# Patient Record
Sex: Male | Born: 1955
Health system: Southern US, Community
[De-identification: ages and names within clinical notes are randomized; demographics above are authoritative.]

## PROBLEM LIST (undated history)

## (undated) DIAGNOSIS — G4700 Insomnia, unspecified: Secondary | ICD-10-CM

## (undated) DIAGNOSIS — D126 Benign neoplasm of colon, unspecified: Secondary | ICD-10-CM

## (undated) DIAGNOSIS — Z86010 Personal history of colonic polyps: Secondary | ICD-10-CM

## (undated) DIAGNOSIS — R011 Cardiac murmur, unspecified: Secondary | ICD-10-CM

## (undated) HISTORY — PX: SKIN GRAFT: SHX250

## (undated) HISTORY — DX: Benign neoplasm of colon, unspecified: D12.6

## (undated) HISTORY — DX: Personal history of colonic polyps: Z86.010

## (undated) HISTORY — DX: Insomnia, unspecified: G47.00

## (undated) HISTORY — DX: Cardiac murmur, unspecified: R01.1

## (undated) HISTORY — PX: COLONOSCOPY: SHX174

---

## 1982-11-11 HISTORY — PX: APPENDECTOMY: SHX54

## 2000-11-20 ENCOUNTER — Encounter: Admission: RE | Admit: 2000-11-20 | Discharge: 2000-11-20 | Payer: Self-pay | Admitting: Internal Medicine

## 2000-11-20 ENCOUNTER — Encounter: Payer: Self-pay | Admitting: Internal Medicine

## 2000-12-23 ENCOUNTER — Other Ambulatory Visit: Admission: RE | Admit: 2000-12-23 | Discharge: 2000-12-23 | Payer: Self-pay | Admitting: Orthopedic Surgery

## 2003-01-04 ENCOUNTER — Encounter: Payer: Self-pay | Admitting: Internal Medicine

## 2003-01-04 ENCOUNTER — Encounter: Admission: RE | Admit: 2003-01-04 | Discharge: 2003-01-04 | Payer: Self-pay | Admitting: Internal Medicine

## 2008-09-12 ENCOUNTER — Ambulatory Visit: Payer: Self-pay | Admitting: Internal Medicine

## 2009-03-02 ENCOUNTER — Ambulatory Visit: Payer: Self-pay | Admitting: Internal Medicine

## 2009-03-03 ENCOUNTER — Ambulatory Visit: Payer: Self-pay | Admitting: Internal Medicine

## 2009-07-06 ENCOUNTER — Ambulatory Visit: Payer: Self-pay | Admitting: Internal Medicine

## 2009-08-18 ENCOUNTER — Ambulatory Visit: Payer: Self-pay | Admitting: Internal Medicine

## 2009-12-26 ENCOUNTER — Ambulatory Visit: Payer: Self-pay | Admitting: Internal Medicine

## 2010-06-11 ENCOUNTER — Encounter: Payer: Self-pay | Admitting: Gastroenterology

## 2010-06-11 ENCOUNTER — Ambulatory Visit: Payer: Self-pay | Admitting: Internal Medicine

## 2010-07-10 ENCOUNTER — Encounter (INDEPENDENT_AMBULATORY_CARE_PROVIDER_SITE_OTHER): Payer: Self-pay | Admitting: *Deleted

## 2010-07-12 ENCOUNTER — Ambulatory Visit: Payer: Self-pay | Admitting: Internal Medicine

## 2010-07-26 ENCOUNTER — Ambulatory Visit: Payer: Self-pay | Admitting: Internal Medicine

## 2010-07-31 ENCOUNTER — Encounter: Payer: Self-pay | Admitting: Internal Medicine

## 2010-10-30 ENCOUNTER — Ambulatory Visit: Payer: Self-pay | Admitting: Internal Medicine

## 2010-12-11 NOTE — Letter (Signed)
Summary: [facility] Instructions  [facility]  [address] [place], [place] [address]   Phone: [phone]  Fax: [phone]       CHASE ARNALL    1956-08-31    MRN: [id number]        Procedure Day Dorna Bloom:  Lenor Coffin  07/26/10     Arrival Time:  10:30AM     Procedure Time:  11:30AM     Location of Procedure:                    _X _  [facility] (4th Floor)                       PREPARATION FOR COLONOSCOPY WITH MOVIPREP   Starting 5 days prior to your procedure 07/21/10 do not eat nuts, seeds, popcorn, corn, beans, peas,  salads, or any raw vegetables.  Do not take any fiber supplements (e.g. Metamucil, Citrucel, and Benefiber).  THE DAY BEFORE YOUR PROCEDURE         DATE: 07/25/10  DAY: WEDNESDAY  1.  Drink clear liquids the entire day-NO SOLID FOOD  2.  Do not drink anything colored red or purple.  Avoid juices with pulp.  No orange juice.  3.  Drink at least 64 oz. (8 glasses) of fluid/clear liquids during the day to prevent dehydration and help the prep work efficiently.  CLEAR LIQUIDS INCLUDE: Water Jello Ice Popsicles Tea (sugar ok, no milk/cream) Powdered fruit flavored drinks Coffee (sugar ok, no milk/cream) Gatorade Juice: apple, white grape, white cranberry  Lemonade Clear bullion, consomm, broth Carbonated beverages (any kind) Strained chicken noodle soup Hard Candy                             4.  In the morning, mix first dose of MoviPrep solution:    Empty 1 Pouch A and 1 Pouch B into the disposable container    Add lukewarm drinking water to the top line of the container. Mix to dissolve    Refrigerate (mixed solution should be used within 24 hrs)  5.  Begin drinking the prep at 5:00 p.m. The MoviPrep container is divided by 4 marks.   Every 15 minutes drink the solution down to the next mark (approximately 8 oz) until the full liter is complete.   6.  Follow completed prep with 16 oz of clear liquid of your choice  (Nothing red or purple).  Continue to drink clear liquids until bedtime.  7.  Before going to bed, mix second dose of MoviPrep solution:    Empty 1 Pouch A and 1 Pouch B into the disposable container    Add lukewarm drinking water to the top line of the container. Mix to dissolve    Refrigerate  THE DAY OF YOUR PROCEDURE      DATE: 07/26/10  DAY: THURSDAY  Beginning at 6:30AM (5 hours before procedure):         1. Every 15 minutes, drink the solution down to the next mark (approx 8 oz) until the full liter is complete.  2. Follow completed prep with 16 oz. of clear liquid of your choice.    3. You may drink clear liquids until 9:30AM (2 HOURS BEFORE PROCEDURE).   MEDICATION INSTRUCTIONS  Unless otherwise instructed, you should take regular prescription medications with a small sip of water   as early as possible the morning of  your procedure.                       OTHER INSTRUCTIONS  You will need a responsible adult at least 57 years of age to accompany you and drive you home.   This person must remain in the waiting room during your procedure.  Wear loose fitting clothing that is easily removed.  Leave jewelry and other valuables at home.  However, you may wish to bring a book to read or  an iPod/MP3 player to listen to music as you wait for your procedure to start.  Remove all body piercing jewelry and leave at home.  Total time from sign-in until discharge is approximately 2-3 hours.  You should go home directly after your procedure and rest.  You can resume normal activities the  day after your procedure.  The day of your procedure you should not:   Drive   Make legal decisions   Operate machinery   Drink alcohol   Return to work  You will receive specific instructions about eating, activities and medications before you leave.    The above instructions have been reviewed and explained to me by   Doristine Church RN II  July 12, 2010 1:28  PM    I fully understand and can verbalize these instructions _____________________________ Date _________

## 2010-12-11 NOTE — Letter (Signed)
Summary: Patient Notice- Polyp Results  [facility]  [address] [place], [place] [address]   Phone: [phone]  Fax: [phone]        July 31, 2010 MRN: [id number]    [facility] [address] [place], [place]  [address]    Dear Robert Mora,  The polyps removed from your colon were adenomatous. This means that they were pre-cancerous or that  they had the potential to change into cancer over time.  I recommend that you have a repeat colonoscopy in 5 years to determine if you have developed any new polyps over time. If you develop any new rectal bleeding, abdominal pain or significant bowel habit changes, please contact us before then.  Please call us if you are having persistent problems or have questions about your condition that have not been fully answered at this time.  Sincerely,  Iva Boop MD, [facility]  This letter has been electronically signed by your physician.  Appended Document: Patient Notice- Polyp Results letter mailed

## 2010-12-11 NOTE — Miscellaneous (Signed)
Summary: [facility] previsit  Clinical Lists Changes  Medications: Added new medication of MOVIPREP 100 GM  SOLR (PEG-KCL-NACL-NASULF-NA ASC-C) As per prep instructions. - Signed Rx of MOVIPREP 100 GM  SOLR (PEG-KCL-NACL-NASULF-NA ASC-C) As per prep instructions.;  #1 x 0;  Signed;  Entered by: Doristine Church RN II;  Authorized by: Iva Boop MD, [facility];  Method used: Electronically to [facility] [address], [place], [place], [place]  [address], Ph: [phone], Fax: [phone] Observations: Added new observation of ALLERGY REV: Done (07/12/2010 13:00) Added new observation of NKA: T (07/12/2010 13:00)    Prescriptions: MOVIPREP 100 GM  SOLR (PEG-KCL-NACL-NASULF-NA ASC-C) As per prep instructions.  #1 x 0   Entered by:   Doristine Church RN II   Authorized by:   Iva Boop MD, [facility]   Signed by:   Doristine Church RN II on 07/12/2010   Method used:   Electronically to        [facility] (retail)       [address]       [place], [place]  [address]       Ph: [phone]       Fax: [phone]   RxID:   [id number]

## 2010-12-11 NOTE — Letter (Signed)
Summary: Previsit letter  [facility]  [address] [place], [place] [address]   Phone: [phone]  Fax: [phone]       06/11/2010 MRN: [id number]  [facility] [address] [place], [place]  [address]  Dear Robert Mora,  Welcome to the Gastroenterology Division at [facility].    You are scheduled to see a nurse for your pre-procedure visit on 07-12-10  at 1pm on the 3rd floor at [facility], [address]  We ask that you try to arrive at our office 15 minutes prior to your appointment time to allow for check-in.  Your nurse visit will consist of discussing your medical and surgical history, your immediate family medical history, and your medications.    Please bring a complete list of all your medications or, if you prefer, bring the medication bottles and we will list them.  We will need to be aware of both prescribed and over the counter drugs.  We will need to know exact dosage information as well.  If you are on blood thinners (Coumadin, Plavix, Aggrenox, Ticlid, etc.) please call our office today/prior to your appointment, as we need to consult with your physician about holding your medication.   Please be prepared to read and sign documents such as consent forms, a financial agreement, and acknowledgement forms.  If necessary, and with your consent, a friend or relative is welcome to sit-in on the nurse visit with you.  Please bring your insurance card so that we may make a copy of it.  If your insurance requires a referral to see a specialist, please bring your referral form from your primary care physician.  No co-pay is required for this nurse visit.     If you cannot keep your appointment, please call [phone] to cancel or reschedule prior to your appointment date.  This allows [place] the opportunity to schedule an appointment for another patient in need of care.    Thank you for choosing [facility] for your medical needs.  We  appreciate the opportunity to care for you.  Please visit [place] at our website  to learn more about our practice.                     Sincerely.                                                                                                                   The Gastroenterology Division

## 2010-12-11 NOTE — Procedures (Signed)
Summary: Colonoscopy  Patient: [name] Note: All result statuses are Final unless otherwise noted.  Tests: (1) Colonoscopy (COL)   COL Colonoscopy           DONE     [facility]     [address]     [place], [place]  [address]           COLONOSCOPY PROCEDURE REPORT           PATIENT:  Robert Mora, Robert Mora  MR#:  [id number]     BIRTHDATE:  1956-03-10, 54 yrs. old  GENDER:  male     ENDOSCOPIST:  Iva Boop, MD, [facility]     REF. BY:  Sharlet Salina, M.D.     PROCEDURE DATE:  07/26/2010     PROCEDURE:  Colonoscopy with biopsy and snare polypectomy     ASA CLASS:  Class I     INDICATIONS:  Routine Risk Screening     MEDICATIONS:   Fentanyl 75 mcg IV, Versed 7 mg IV           DESCRIPTION OF PROCEDURE:   After the risks benefits and     alternatives of the procedure were thoroughly explained, informed     consent was obtained.  Digital rectal exam was performed and     revealed no abnormalities and normal prostate.   The [id number] [id number]     endoscope was introduced through the anus and advanced to the     cecum, which was identified by both the appendix and ileocecal     valve, without limitations.  The quality of the prep was good,     using MoviPrep.  The instrument was then slowly withdrawn as the     colon was fully examined. Insertion: 4:17 minutes Withdrawal:     18:40 minutes     <<PROCEDUREIMAGES>>           FINDINGS:  Three polyps were found. Hepatic flexure (4mm and 8 mm)     and descending colon (3mm). The 3 and 4 mm polyps were removed     using cold biopsy forceps. The 8 mm polyp was snared without     cautery. Retrieval was successful.   This was otherwise a normal     examination of the colon.   Retroflexed views in the rectum     revealed no abnormalities.    The scope was then withdrawn from     the patient and the procedure completed.           COMPLICATIONS:  None     ENDOSCOPIC IMPRESSION:     1) Three polyps removed, maximum size 8mm.     2)  Otherwise normal examination, good prep           REPEAT EXAM:  In for Colonoscopy, pending biopsy results.           Iva Boop, MD, [facility]           CC:  Sharlet Salina, MD and The Patient           n.     eSIGNED:   Iva Boop at 07/26/2010 12:25 PM           Schroeter, Rayna Sexton, [id number]  Note: An exclamation mark (!) indicates a result that was not dispersed into the flowsheet. Document Creation Date: 07/26/2010 12:26 PM _______________________________________________________________________  (1) Order result status: Final Collection or observation date-time: 07/26/2010 12:17 Requested date-time:  Receipt  date-time:  Reported date-time:  Referring Physician:   Ordering Physician: Stan Head [phone]) Specimen Source:  Source: Launa Grill Order Number: [id number] Lab site:   Appended Document: Colonoscopy     Procedures Next Due Date:    Colonoscopy: 07/2015

## 2011-04-09 ENCOUNTER — Encounter: Payer: Self-pay | Admitting: *Deleted

## 2011-04-09 ENCOUNTER — Encounter: Payer: Self-pay | Admitting: Internal Medicine

## 2011-04-09 ENCOUNTER — Ambulatory Visit (INDEPENDENT_AMBULATORY_CARE_PROVIDER_SITE_OTHER): Payer: PRIVATE HEALTH INSURANCE | Admitting: Internal Medicine

## 2011-04-09 VITALS — BP 114/76 | HR 84 | Temp 98.6°F | Wt 152.5 lb

## 2011-04-09 DIAGNOSIS — M791 Myalgia, unspecified site: Secondary | ICD-10-CM

## 2011-04-09 DIAGNOSIS — IMO0001 Reserved for inherently not codable concepts without codable children: Secondary | ICD-10-CM

## 2011-04-09 MED ORDER — PREDNISONE 10 MG PO KIT: PACK | ORAL | Status: DC

## 2011-04-09 MED ORDER — HYDROCODONE-ACETAMINOPHEN 5-500 MG PO TABS: 2.0000 | ORAL_TABLET | Freq: Four times a day (QID) | ORAL | Status: AC | PRN

## 2011-04-09 NOTE — Progress Notes (Signed)
  Subjective:    Patient ID: Robert Mora, male    DOB: 02-May-1956, 55 y.o.   MRN: [id number]  HPI 2 day history of left parascapular pain with some pain down back of left arm. 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 Took some leftover Hydrocodone.    Review of Systems     Objective:   Physical Exam Trigger points left medial scapula.  Spasm left SCM muscle. DTRs 2+ and =. Muscle strength is normal in left UE        Assessment & Plan:  Musculoskeletal l pain left neck and scapula Plan is to prescribe Sterapred DS 10 mg 6 day dosepack and Vicodin 5/500(#40) one po q 6 hours prn pain

## 2011-04-09 NOTE — Patient Instructions (Signed)
Take meds as directed . Apply heat or ice to shoulder . Call if not better in 7 to 10 days

## 2011-06-21 ENCOUNTER — Other Ambulatory Visit: Payer: PRIVATE HEALTH INSURANCE | Admitting: Internal Medicine

## 2011-06-21 DIAGNOSIS — Z0000 Encounter for general adult medical examination without abnormal findings: Secondary | ICD-10-CM

## 2011-06-21 LAB — COMPREHENSIVE METABOLIC PANEL
ALT: 10 U/L (ref 0–53)
AST: 17 U/L (ref 0–37)
Albumin: 4.2 g/dL (ref 3.5–5.2)
Alkaline Phosphatase: 78 U/L (ref 39–117)
BUN: 16 mg/dL (ref 6–23)
CO2: 25 mEq/L (ref 19–32)
Calcium: 9.1 mg/dL (ref 8.4–10.5)
Chloride: 108 mEq/L (ref 96–112)
Creat: 1.05 mg/dL (ref 0.50–1.35)
Glucose, Bld: 82 mg/dL (ref 70–99)
Potassium: 4 mEq/L (ref 3.5–5.3)
Sodium: 142 mEq/L (ref 135–145)
Total Bilirubin: 0.5 mg/dL (ref 0.3–1.2)
Total Protein: 6.2 g/dL (ref 6.0–8.3)

## 2011-06-21 LAB — CBC WITH DIFFERENTIAL/PLATELET
Basophils Absolute: 0 10*3/uL (ref 0.0–0.1)
Basophils Relative: 0 % (ref 0–1)
Eosinophils Absolute: 0.2 10*3/uL (ref 0.0–0.7)
Eosinophils Relative: 3 % (ref 0–5)
HCT: 40.6 % (ref 39.0–52.0)
Hemoglobin: 13.4 g/dL (ref 13.0–17.0)
Lymphocytes Relative: 25 % (ref 12–46)
Lymphs Abs: 2.3 10*3/uL (ref 0.7–4.0)
MCH: 32.1 pg (ref 26.0–34.0)
MCHC: 33 g/dL (ref 30.0–36.0)
MCV: 97.4 fL (ref 78.0–100.0)
Monocytes Absolute: 0.6 10*3/uL (ref 0.1–1.0)
Monocytes Relative: 6 % (ref 3–12)
Neutro Abs: 6.2 10*3/uL (ref 1.7–7.7)
Neutrophils Relative %: 67 % (ref 43–77)
Platelets: 186 10*3/uL (ref 150–400)
RBC: 4.17 MIL/uL — ABNORMAL LOW (ref 4.22–5.81)
RDW: 13.7 % (ref 11.5–15.5)
WBC: 9.3 10*3/uL (ref 4.0–10.5)

## 2011-06-21 LAB — LIPID PANEL
Cholesterol: 223 mg/dL — ABNORMAL HIGH (ref 0–200)
HDL: 48 mg/dL (ref >39)
LDL Cholesterol: 153 mg/dL — ABNORMAL HIGH (ref 0–99)
Total CHOL/HDL Ratio: 4.6 Ratio
Triglycerides: 108 mg/dL (ref <150)
VLDL: 22 mg/dL (ref 0–40)

## 2011-06-24 ENCOUNTER — Ambulatory Visit (INDEPENDENT_AMBULATORY_CARE_PROVIDER_SITE_OTHER): Payer: PRIVATE HEALTH INSURANCE | Admitting: Internal Medicine

## 2011-06-24 ENCOUNTER — Encounter: Payer: Self-pay | Admitting: Internal Medicine

## 2011-06-24 VITALS — BP 118/76 | HR 74 | Temp 98.3°F | Ht 67.0 in | Wt 151.0 lb

## 2011-06-24 DIAGNOSIS — Z0000 Encounter for general adult medical examination without abnormal findings: Secondary | ICD-10-CM

## 2011-06-24 DIAGNOSIS — B351 Tinea unguium: Secondary | ICD-10-CM

## 2011-06-24 DIAGNOSIS — D126 Benign neoplasm of colon, unspecified: Secondary | ICD-10-CM

## 2011-06-24 DIAGNOSIS — Z8619 Personal history of other infectious and parasitic diseases: Secondary | ICD-10-CM

## 2011-06-24 DIAGNOSIS — F39 Unspecified mood [affective] disorder: Secondary | ICD-10-CM

## 2011-06-24 LAB — POCT URINALYSIS DIPSTICK
Bilirubin, UA: NEGATIVE
Blood, UA: NEGATIVE
Ketones, UA: NEGATIVE
Leukocytes, UA: NEGATIVE
Nitrite, UA: NEGATIVE
Protein, UA: NEGATIVE
Urobilinogen, UA: NEGATIVE

## 2011-08-11 ENCOUNTER — Encounter: Payer: Self-pay | Admitting: Internal Medicine

## 2011-08-11 DIAGNOSIS — B351 Tinea unguium: Secondary | ICD-10-CM | POA: Insufficient documentation

## 2011-08-11 DIAGNOSIS — Z86010 Personal history of colonic polyps: Secondary | ICD-10-CM | POA: Insufficient documentation

## 2011-08-11 DIAGNOSIS — Z860101 Personal history of adenomatous and serrated colon polyps: Secondary | ICD-10-CM

## 2011-08-11 DIAGNOSIS — F39 Unspecified mood [affective] disorder: Secondary | ICD-10-CM | POA: Insufficient documentation

## 2011-08-11 DIAGNOSIS — Z8619 Personal history of other infectious and parasitic diseases: Secondary | ICD-10-CM | POA: Insufficient documentation

## 2011-08-11 HISTORY — DX: Personal history of colonic polyps: Z86.010

## 2011-08-11 HISTORY — DX: Personal history of adenomatous and serrated colon polyps: Z86.0101
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
## 2011-10-21 ENCOUNTER — Ambulatory Visit (INDEPENDENT_AMBULATORY_CARE_PROVIDER_SITE_OTHER): Payer: PRIVATE HEALTH INSURANCE | Admitting: Internal Medicine

## 2011-10-21 DIAGNOSIS — Z23 Encounter for immunization: Secondary | ICD-10-CM

## 2011-11-08 ENCOUNTER — Ambulatory Visit (INDEPENDENT_AMBULATORY_CARE_PROVIDER_SITE_OTHER): Payer: PRIVATE HEALTH INSURANCE | Admitting: Internal Medicine

## 2011-11-08 ENCOUNTER — Encounter: Payer: Self-pay | Admitting: Internal Medicine

## 2011-11-08 VITALS — BP 126/78 | HR 76 | Temp 98.1°F | Ht 67.0 in | Wt 155.0 lb

## 2011-11-08 DIAGNOSIS — S93402A Sprain of unspecified ligament of left ankle, initial encounter: Secondary | ICD-10-CM

## 2011-11-08 DIAGNOSIS — S93409A Sprain of unspecified ligament of unspecified ankle, initial encounter: Secondary | ICD-10-CM

## 2011-11-08 NOTE — Patient Instructions (Addendum)
Wear ankle dressing for 24-48 hours and then cut it off. Keep ankle elevated as much as possible and stay off of it as much as possible over the next 3 or 4 days. Continue Vicodin and anti-inflammatory medication as previously prescribed by urgent care center in [place]. Once ankle dressing is removed, apply ace wrap and continue to wear postop shoe until ankle is no longer painful. Then use Ace wrap and tennis shoe for several days up to 2 weeks. Call if symptoms not improved in 2-4 weeks

## 2011-11-08 NOTE — Progress Notes (Signed)
  Subjective:    Patient ID: Robert Mora, male    DOB: 11/13/1955, 55 y.o.   MRN: [id number]  HPI 55 year old white male came down a staircase on December 23 slipped and twisted left ankle. Did not hear any snapping or popping. Went to urgent care Center in [place] where he was given a walking boot, Vicodin prescription, and anti-inflammatory medication prescription. He had x-rays of his ankle. Was told that he had perhaps an avulsion fracture of the ankle. I have reviewed x-ray today brought in by patient on disc. It looks like he has an old avulsion of the tibia but the actual injury is in the fibula area. Do not see avulsion fracture of the fibula.    Review of Systems     Objective:   Physical Exam has tenderness about left fibula. Slight swelling about left fibula. Bruising anterior and inferior to left fibula. Significant tenderness anterior to left fibula consistent with torn ligament.        Assessment & Plan:  Left ankle sprain  Plan: Left ankle was wrapped with webril followed by coban. Was placed in a postop shoe. He will wear this wrap for 24-48 hours and then remove it. Then will use Ace wrap and postop shoe until ankle has healed significantly over the next several days. Then may move into Ace wrap and tennis shoe. Keep ankle elevated and stay off of it as much as possible. Try to ice it down twice daily for 20 minutes. Call if not better in 2-4 weeks.

## 2011-12-26 ENCOUNTER — Other Ambulatory Visit: Payer: PRIVATE HEALTH INSURANCE | Admitting: Internal Medicine

## 2011-12-26 DIAGNOSIS — Z79899 Other long term (current) drug therapy: Secondary | ICD-10-CM

## 2011-12-26 DIAGNOSIS — E785 Hyperlipidemia, unspecified: Secondary | ICD-10-CM

## 2011-12-26 LAB — HEPATIC FUNCTION PANEL
AST: 23 U/L (ref 0–37)
Alkaline Phosphatase: 92 U/L (ref 39–117)
Indirect Bilirubin: 0.3 mg/dL (ref 0.0–0.9)
Total Protein: 6.4 g/dL (ref 6.0–8.3)

## 2011-12-26 LAB — LIPID PANEL
Cholesterol: 239 mg/dL — ABNORMAL HIGH (ref 0–200)
LDL Cholesterol: 166 mg/dL — ABNORMAL HIGH (ref 0–99)
Triglycerides: 103 mg/dL (ref <150)

## 2011-12-27 ENCOUNTER — Ambulatory Visit (INDEPENDENT_AMBULATORY_CARE_PROVIDER_SITE_OTHER): Payer: PRIVATE HEALTH INSURANCE | Admitting: Internal Medicine

## 2011-12-27 ENCOUNTER — Encounter: Payer: Self-pay | Admitting: Internal Medicine

## 2011-12-27 VITALS — BP 120/68 | HR 80 | Wt 159.0 lb

## 2011-12-27 DIAGNOSIS — E785 Hyperlipidemia, unspecified: Secondary | ICD-10-CM | POA: Insufficient documentation

## 2011-12-27 DIAGNOSIS — F39 Unspecified mood [affective] disorder: Secondary | ICD-10-CM

## 2012-01-12 NOTE — Patient Instructions (Signed)
Continue Zocor 10 mg daily but try to diet and exercise more. Return in 6 months. For hemorrhoidal irritation try ProctoCream-HC 4 times a day

## 2012-01-12 NOTE — Progress Notes (Addendum)
  Subjective:    Patient ID: Robert Mora, male    DOB: [birth year], 56 y.o.   MRN: [id number]  HPI Paient in today to followup on hyperlipidemia and mood disorder. Currently on Zocor 10 mg daily. Lipid panel is not quite as good as I would like to see it. Patient admits he has not been following a low-fat diet. Does well on Wellbutrin for dysphoria. Has a new problem today. Has a hemorrhoidal tag is irritated from time to time.    Review of Systems     Objective:   Physical Exam chest clear to auscultation, cardiac exam: regular rate and rhythm, extremities without edema. Rectal exam shows external hemorrhoidal tag slightly irritated but not bleeding.        Assessment & Plan:  Hyperlipidemia  Mood disorder  Hemorrhoidal tag-irritated  Plan: ProctoCream-HC to use in rectal area 4 times a day . May take sitz baths daily as needed. Increase  Zocor to 20 mg daily for long with  strict diet and exercise. Continue Wellbutrin. Return in 6 months. Will need fasting lipid panel liver functions at that time.

## 2012-03-13 ENCOUNTER — Other Ambulatory Visit: Payer: Self-pay

## 2012-03-13 MED ORDER — BUPROPION HCL ER (XL) 300 MG PO TB24: 300.0000 mg | ORAL_TABLET | Freq: Every day | ORAL | Status: DC

## 2012-04-27 ENCOUNTER — Ambulatory Visit (INDEPENDENT_AMBULATORY_CARE_PROVIDER_SITE_OTHER): Payer: PRIVATE HEALTH INSURANCE | Admitting: Internal Medicine

## 2012-04-27 ENCOUNTER — Encounter: Payer: Self-pay | Admitting: Internal Medicine

## 2012-04-27 VITALS — BP 110/62 | HR 80 | Temp 98.6°F | Ht 67.0 in | Wt 154.0 lb

## 2012-04-27 DIAGNOSIS — Z23 Encounter for immunization: Secondary | ICD-10-CM

## 2012-04-27 DIAGNOSIS — M722 Plantar fascial fibromatosis: Secondary | ICD-10-CM

## 2012-04-27 DIAGNOSIS — M7730 Calcaneal spur, unspecified foot: Secondary | ICD-10-CM

## 2012-04-27 DIAGNOSIS — R2240 Localized swelling, mass and lump, unspecified lower limb: Secondary | ICD-10-CM

## 2012-04-27 DIAGNOSIS — R229 Localized swelling, mass and lump, unspecified: Secondary | ICD-10-CM

## 2012-04-27 NOTE — Patient Instructions (Addendum)
Please advise if heel  continues to be painful after 2 weeks status post injection. Please advise if nodule on dorsum of foot enlarges.

## 2012-04-27 NOTE — Progress Notes (Signed)
  Subjective:    Patient ID: Robert Mora, male    DOB: [birth year], 56 y.o.   MRN: [id number]  HPI 56 year old white male in today with complaint of left heel pain which been present for about a month. Hurts to bear weight on his heel and he is limping today. He wears tennis shoes most of the time to look to be very comfortable. He doesn't recall any sort of injury to the heel area. Is also concerned about a not on the dorsum of his foot that's about a centimeter in length that has suddenly appeared. It is not painful. Denies any injury to his lower extremity other than an ankle sprain with  fibula avulsion in the past. He says his ankle is nontender.  Review of Systems    Objective:   Physical Exam  1 cm mobile nodule dorsum of foot nontender to palpation. No redness . Doesn't appear to be associated with metatarsal such as a bony abnormality. Heel has point tenderness mid heel area. No redness or swelling. Left ankle full range of motion, not swollen, no erythema.         Assessment & Plan:   Left heel pain  Nodule dorsum of foot  Plan: Left heel was injected with Depo-Medrol, Marcaine, Xylocaine 1%. If heel pain persists, consider x-ray. Suspect patient has a heel spur and some soft tissue inflammation. May need to have a heel cup inserted in shoe.suspect nodule on dorsum of foot is benign. He will continue to monitor it. If it enlarges we will consider referral to orthopedist for removal. Do not think x-rays will be of help at this point in time. He has a high deductible. We will try conservative treatment before obtaining any x-rays.

## 2012-06-25 ENCOUNTER — Other Ambulatory Visit: Payer: PRIVATE HEALTH INSURANCE | Admitting: Internal Medicine

## 2012-06-25 DIAGNOSIS — Z79899 Other long term (current) drug therapy: Secondary | ICD-10-CM

## 2012-06-25 DIAGNOSIS — Z125 Encounter for screening for malignant neoplasm of prostate: Secondary | ICD-10-CM

## 2012-06-25 DIAGNOSIS — E785 Hyperlipidemia, unspecified: Secondary | ICD-10-CM

## 2012-06-25 DIAGNOSIS — Z0000 Encounter for general adult medical examination without abnormal findings: Secondary | ICD-10-CM

## 2012-06-25 LAB — PSA: PSA: 0.33 ng/mL (ref <=4.00)

## 2012-06-25 LAB — LIPID PANEL
HDL: 46 mg/dL (ref >39)
LDL Cholesterol: 154 mg/dL — ABNORMAL HIGH (ref 0–99)
Total CHOL/HDL Ratio: 4.9 Ratio
VLDL: 25 mg/dL (ref 0–40)

## 2012-06-25 LAB — CBC WITH DIFFERENTIAL/PLATELET
Basophils Relative: 0 % (ref 0–1)
Eosinophils Absolute: 0.3 10*3/uL (ref 0.0–0.7)
Hemoglobin: 13.5 g/dL (ref 13.0–17.0)
MCH: 32.1 pg (ref 26.0–34.0)
MCHC: 34.6 g/dL (ref 30.0–36.0)
Monocytes Absolute: 0.7 10*3/uL (ref 0.1–1.0)
Monocytes Relative: 8 % (ref 3–12)
Neutrophils Relative %: 65 % (ref 43–77)
RDW: 13.2 % (ref 11.5–15.5)

## 2012-06-25 LAB — COMPREHENSIVE METABOLIC PANEL
Alkaline Phosphatase: 89 U/L (ref 39–117)
BUN: 14 mg/dL (ref 6–23)
Glucose, Bld: 85 mg/dL (ref 70–99)
Sodium: 141 mEq/L (ref 135–145)
Total Bilirubin: 0.3 mg/dL (ref 0.3–1.2)

## 2012-06-26 ENCOUNTER — Ambulatory Visit (INDEPENDENT_AMBULATORY_CARE_PROVIDER_SITE_OTHER): Payer: PRIVATE HEALTH INSURANCE | Admitting: Internal Medicine

## 2012-06-26 ENCOUNTER — Encounter: Payer: Self-pay | Admitting: Internal Medicine

## 2012-06-26 VITALS — BP 110/76 | HR 72 | Temp 98.2°F | Ht 67.75 in | Wt 156.0 lb

## 2012-06-26 DIAGNOSIS — Z8619 Personal history of other infectious and parasitic diseases: Secondary | ICD-10-CM

## 2012-06-26 DIAGNOSIS — F39 Unspecified mood [affective] disorder: Secondary | ICD-10-CM

## 2012-06-26 DIAGNOSIS — Z86010 Personal history of colonic polyps: Secondary | ICD-10-CM

## 2012-06-26 DIAGNOSIS — E785 Hyperlipidemia, unspecified: Secondary | ICD-10-CM

## 2012-06-26 DIAGNOSIS — Z0000 Encounter for general adult medical examination without abnormal findings: Secondary | ICD-10-CM

## 2012-06-26 LAB — POCT URINALYSIS DIPSTICK
Bilirubin, UA: NEGATIVE
Leukocytes, UA: NEGATIVE
Nitrite, UA: NEGATIVE
Protein, UA: NEGATIVE
pH, UA: 7.5

## 2012-06-28 ENCOUNTER — Encounter: Payer: Self-pay | Admitting: Internal Medicine
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
## 2012-06-28 NOTE — Patient Instructions (Addendum)
Change to Crestor 10 mg daily-samples provided. Return in 3 months for fasting lipid panel liver functions.

## 2012-10-23 ENCOUNTER — Other Ambulatory Visit (INDEPENDENT_AMBULATORY_CARE_PROVIDER_SITE_OTHER): Payer: PRIVATE HEALTH INSURANCE | Admitting: Internal Medicine

## 2012-10-23 DIAGNOSIS — Z8619 Personal history of other infectious and parasitic diseases: Secondary | ICD-10-CM

## 2012-10-23 DIAGNOSIS — E785 Hyperlipidemia, unspecified: Secondary | ICD-10-CM

## 2012-10-23 DIAGNOSIS — Z23 Encounter for immunization: Secondary | ICD-10-CM

## 2012-10-23 LAB — HEPATIC FUNCTION PANEL
AST: 20 U/L (ref 0–37)
Albumin: 4 g/dL (ref 3.5–5.2)
Alkaline Phosphatase: 77 U/L (ref 39–117)
Bilirubin, Direct: 0.1 mg/dL (ref 0.0–0.3)
Total Bilirubin: 0.5 mg/dL (ref 0.3–1.2)

## 2012-10-23 LAB — LIPID PANEL
HDL: 49 mg/dL (ref >39)
LDL Cholesterol: 153 mg/dL — ABNORMAL HIGH (ref 0–99)

## 2012-10-27 NOTE — Progress Notes (Signed)
Patient informed. Samples provided and will schedule labwork in 3 months

## 2012-11-12 ENCOUNTER — Ambulatory Visit
Admission: RE | Admit: 2012-11-12 | Discharge: 2012-11-12 | Disposition: A | Discharge status: Home / Self Care | Payer: PRIVATE HEALTH INSURANCE | Source: Ambulatory Visit | Attending: Chiropractic Medicine | Admitting: Chiropractic Medicine

## 2012-11-12 ENCOUNTER — Other Ambulatory Visit: Payer: Self-pay | Admitting: Chiropractic Medicine

## 2012-11-12 DIAGNOSIS — M1990 Unspecified osteoarthritis, unspecified site: Secondary | ICD-10-CM

## 2012-11-12 DIAGNOSIS — M7540 Impingement syndrome of unspecified shoulder: Secondary | ICD-10-CM

## 2012-11-12 DIAGNOSIS — M25819 Other specified joint disorders, unspecified shoulder: Secondary | ICD-10-CM

## 2012-11-12 DIAGNOSIS — M25519 Pain in unspecified shoulder: Secondary | ICD-10-CM

## 2012-11-16 ENCOUNTER — Other Ambulatory Visit: Payer: Self-pay | Admitting: Internal Medicine

## 2013-01-11 ENCOUNTER — Other Ambulatory Visit: Payer: PRIVATE HEALTH INSURANCE | Admitting: Internal Medicine

## 2013-01-11 DIAGNOSIS — E785 Hyperlipidemia, unspecified: Secondary | ICD-10-CM

## 2013-01-11 DIAGNOSIS — Z79899 Other long term (current) drug therapy: Secondary | ICD-10-CM

## 2013-01-11 LAB — LIPID PANEL
Cholesterol: 223 mg/dL — ABNORMAL HIGH (ref 0–200)
HDL: 43 mg/dL (ref >39)
Total CHOL/HDL Ratio: 5.2 Ratio
VLDL: 28 mg/dL (ref 0–40)

## 2013-01-11 LAB — HEPATIC FUNCTION PANEL
Alkaline Phosphatase: 95 U/L (ref 39–117)
Total Bilirubin: 0.3 mg/dL (ref 0.3–1.2)
Total Protein: 6.4 g/dL (ref 6.0–8.3)

## 2013-01-12 ENCOUNTER — Telehealth: Payer: Self-pay | Admitting: Internal Medicine

## 2013-01-12 NOTE — Telephone Encounter (Signed)
Patient yesterday for lipid panel liver functions on Crestor 20 mg daily. Dose had been increased from 10-20 mg about 3 months ago. There is no change in his lipid results from 10-20 mg of Crestor which is disappointing. He says he's been compliant with the medication. He says he probably is not following a low-fat diet. Says mother has similar history. We will refer to dietitian. Continue Crestor 20 mg daily and recheck at time of physical exam in August 2014

## 2013-02-05 ENCOUNTER — Ambulatory Visit: Payer: PRIVATE HEALTH INSURANCE | Admitting: Dietician

## 2013-02-22 ENCOUNTER — Encounter: Payer: BC Managed Care – PPO | Attending: Internal Medicine | Admitting: Dietician

## 2013-02-22 ENCOUNTER — Encounter: Payer: Self-pay | Admitting: Dietician

## 2013-02-22 DIAGNOSIS — E785 Hyperlipidemia, unspecified: Secondary | ICD-10-CM

## 2013-02-22 DIAGNOSIS — E7800 Pure hypercholesterolemia, unspecified: Secondary | ICD-10-CM | POA: Insufficient documentation

## 2013-02-22 DIAGNOSIS — Z713 Dietary counseling and surveillance: Secondary | ICD-10-CM | POA: Insufficient documentation

## 2013-02-22 NOTE — Progress Notes (Signed)
Medical Nutrition Therapy:  Appt start time: 1030 end time:  1130.  Assessment:  Primary concerns today: hypercholesterolemia.   MEDICATIONS: see list.  Labs- TC 223, LDL 152   DIETARY INTAKE:  Usual eating pattern includes 2 meals and 1 snacks per day.  Everyday foods include smoothie.  Avoided foods include most fish, seafood.    24-hr recall:  B ( AM): smoothie or protein shake. Smoothie has blueberries, acai juice, protein powder (muscle milk), almond milk. Coffee with 2 sugars and half and half (1 Tbsp)  Snk ( AM): none  L ( PM): pack of nabs, almonds, peanuts. Salad, sometimes Mindi Slicker. Tend to not eat much for lunch. Has little access to refrigeration for lunch Snk ( PM): sometimes chips and salsa or cheese slices with crackers, hummus D ( PM): kale and broccoli salad, grilled asparagus, [place] strip. Often works late, so often eats out- pizza, [place] food, Chipotle.  Snk ( PM): usually not. Occasionally a dessert item like doughnut or ice cream. Beverages: decaf coffee, limited sodas, sweet decaf tea. Avoids caffeine. No juice or gatorade. Occasional beer socially.   Pt has trouble eating in AM, limited in options and time to eat for lunch. Seems to overeat snack type foods in afternoon, eats a good deal of cheese, and drinks 2-3 sweet teas each day.  Usual physical activity: gym four times per week at 1-1.5 hours. Mostly cardio. 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 Gardening (recently started for spring). On feet most of day for work.   Progress Towards Goal(s):  In progress.   Nutritional Diagnosis:  NI-5.6.3 Inappropriate intake of fats (specify): saturated fat As related to hypercholesterolemia.  As evidenced by high consumption of cheese, half and half.    Intervention:  Nutrition education provided regarding restriction of animal-based saturated fats, all trans fats, and increasing omega 3 fatty acids. RD also discussed increased intake of fiber, regular  intake of high fiber and high protein foods to prevent overeating snack foods, options to pack for lunches requiring no refrigeration and little time, and increased intake of plant-based proteins.   RD also instructed pt to reduce intake of sweetened beverages, as this may increase triglycerides.  Handouts given during visit include:  Cholesterol Class materials  Best Food Choices to Improve Blood Lipids  Monitoring/Evaluation:  Dietary intake, exercise, portion of cheese and other saturated fats in 6 week(s).

## 2013-04-12 ENCOUNTER — Ambulatory Visit: Payer: PRIVATE HEALTH INSURANCE | Admitting: Dietician

## 2013-04-30 ENCOUNTER — Encounter: Payer: BC Managed Care – PPO | Attending: Internal Medicine | Admitting: Dietician

## 2013-04-30 ENCOUNTER — Encounter: Payer: Self-pay | Admitting: Dietician

## 2013-04-30 DIAGNOSIS — E785 Hyperlipidemia, unspecified: Secondary | ICD-10-CM

## 2013-04-30 DIAGNOSIS — Z713 Dietary counseling and surveillance: Secondary | ICD-10-CM | POA: Insufficient documentation

## 2013-04-30 DIAGNOSIS — E7800 Pure hypercholesterolemia, unspecified: Secondary | ICD-10-CM | POA: Insufficient documentation

## 2013-04-30 NOTE — Progress Notes (Signed)
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   For lunch, pt has reduced intake of fast food, but mostly eats out. [place] restaurant is common- usually vegetarian meal with refried beans, enchilada. Steakhouse is also common, which is usually a salad, sweet potato (butter only) baked and small steak. [employer] and [place] also common- club sandwich on lettuce wrap or roll. Ham, [place], mayo, oil, vinegar.   For dinner, pt has been eating out less for dinner, and typically eating lighter. Lots of salad dishes, vegetarian proteins. Typically vinagrette, occasionally cream-based.  For snacks, pt has been mainly eating nuts and seeds. Occasionally a dessert item.   For drinks, pt takes coffee in AM, essentially no sodas, sweet tea but only for dinner (down from TID last meeting). Gatorade while on job site. Very few EtOH drinks, sometimes beer when eating out for dinner.   Pt has also received some advise from trainer at his gym, and materials were overall quite good, emphasizing very lean protein sources, whole grains and high fiber foods for CHO sources, as well as a minimum of 5 servings fruit and veg combined.  Pt reports he has lost one pant size since our last meeting as well.  I: RD advised pt to monitor portion sizes for Gatorade and sweet tea, limiting to approximately 10 oz. Each per day. RD advised pt to monitor portion of mayo on sandwiches when eating out for lunch, and choose whole grain versions of starches where possible (whole grain breads for sandwiches, brown and wild rice, cooked grains like barley, oatmeal, whole wheat pasta).   Pt is doing very well, RD encouraged pt to continue the changes adopted to his lifestyle, and F/U PRN based on next blood lipid results.  M/E: Monitor diet, portion control, blood lipid profile. F/U PRN.

## 2013-05-23 ENCOUNTER — Other Ambulatory Visit: Payer: Self-pay | Admitting: Internal Medicine

## 2013-06-11 ENCOUNTER — Other Ambulatory Visit: Payer: BC Managed Care – PPO | Admitting: Internal Medicine

## 2013-06-17 ENCOUNTER — Other Ambulatory Visit: Payer: BC Managed Care – PPO | Admitting: Internal Medicine

## 2013-06-17 DIAGNOSIS — E785 Hyperlipidemia, unspecified: Secondary | ICD-10-CM

## 2013-06-17 LAB — LIPID PANEL
HDL: 50 mg/dL (ref >39)
LDL Cholesterol: 143 mg/dL — ABNORMAL HIGH (ref 0–99)
Total CHOL/HDL Ratio: 4.3 Ratio
Triglycerides: 119 mg/dL (ref <150)
VLDL: 24 mg/dL (ref 0–40)

## 2013-06-22 NOTE — Progress Notes (Signed)
Not on Crestor; only seeing a nutritionist. Will call back tomorrow to schedule CPE

## 2013-06-27 IMAGING — CR DG SHOULDER 2+V*L*
3 series · 3 of 3 positions shown · non-contrast
Comparison: No priors.

CLINICAL DATA: Left-sided shoulder pain.

LEFT SHOULDER - 2+ VIEW

[view not recorded (1 of 3)]
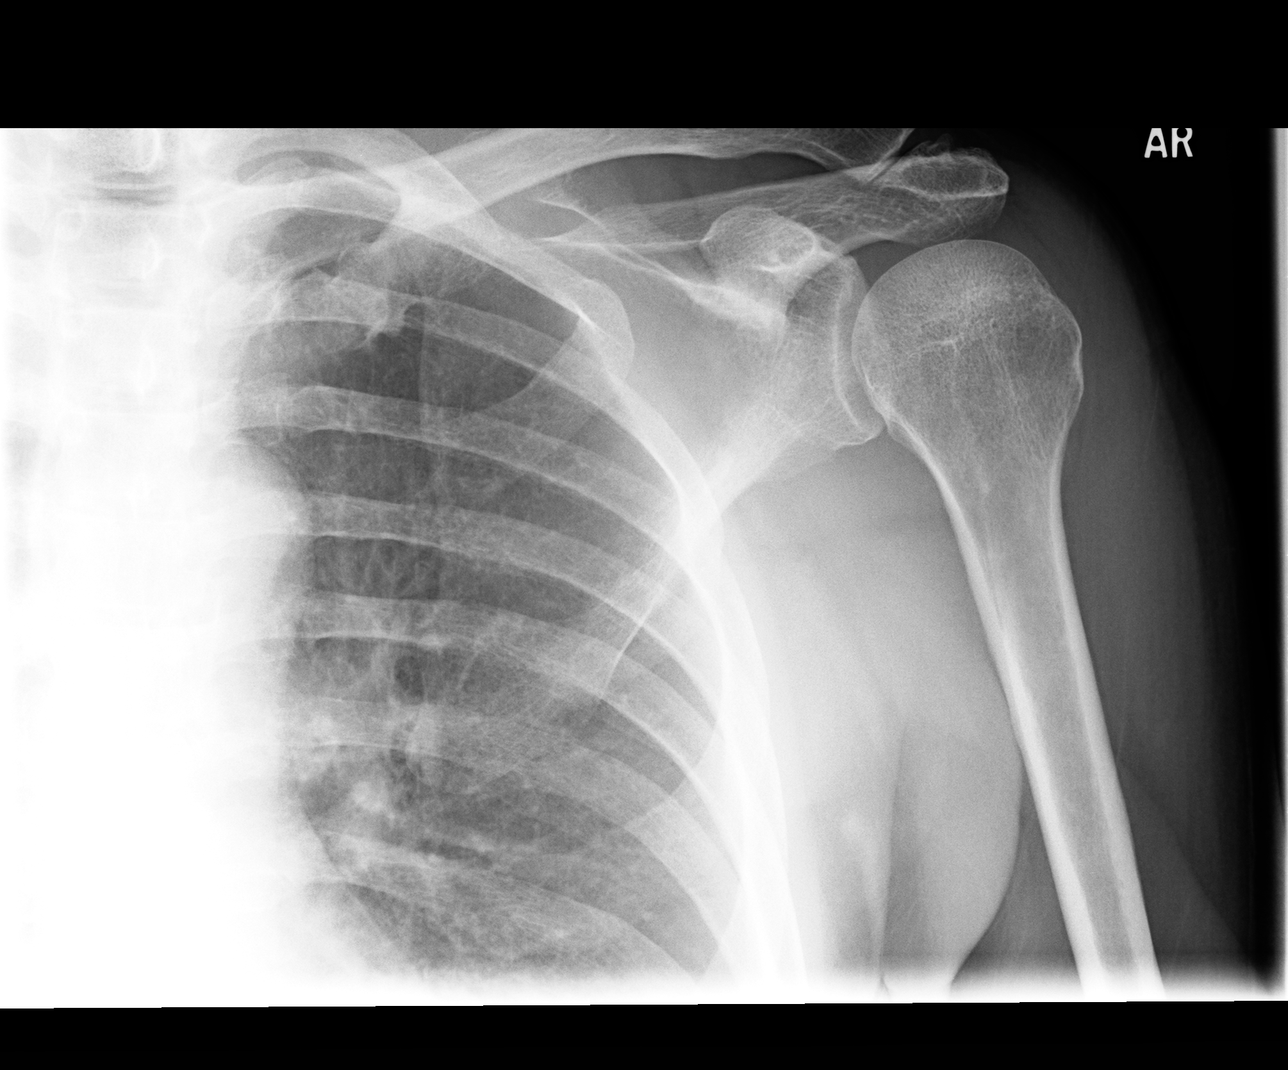

[view not recorded (2 of 3)]
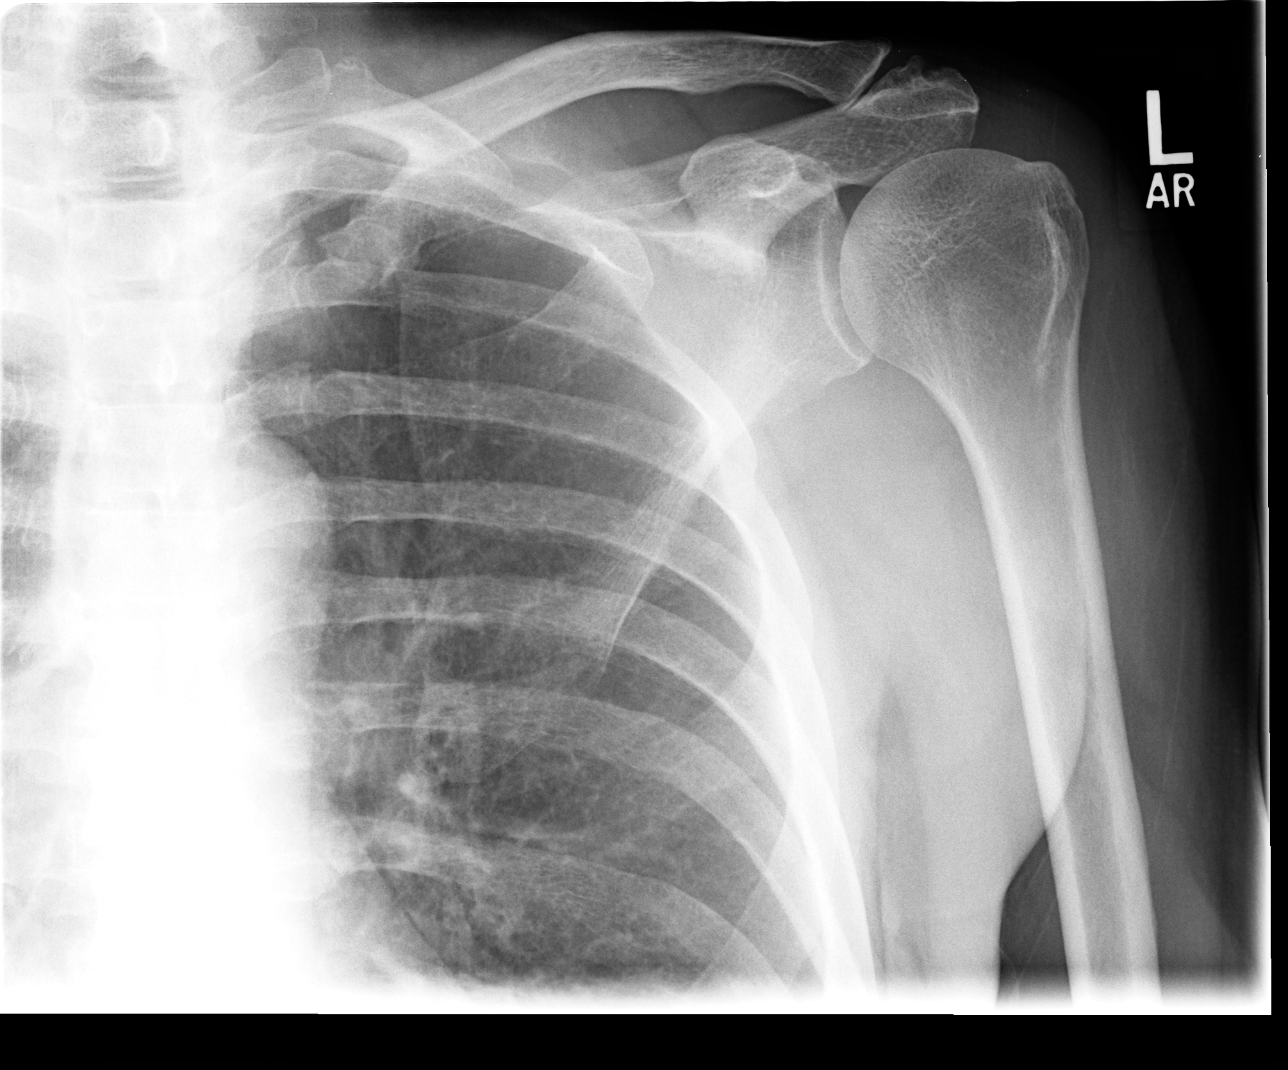

[view not recorded (3 of 3)]
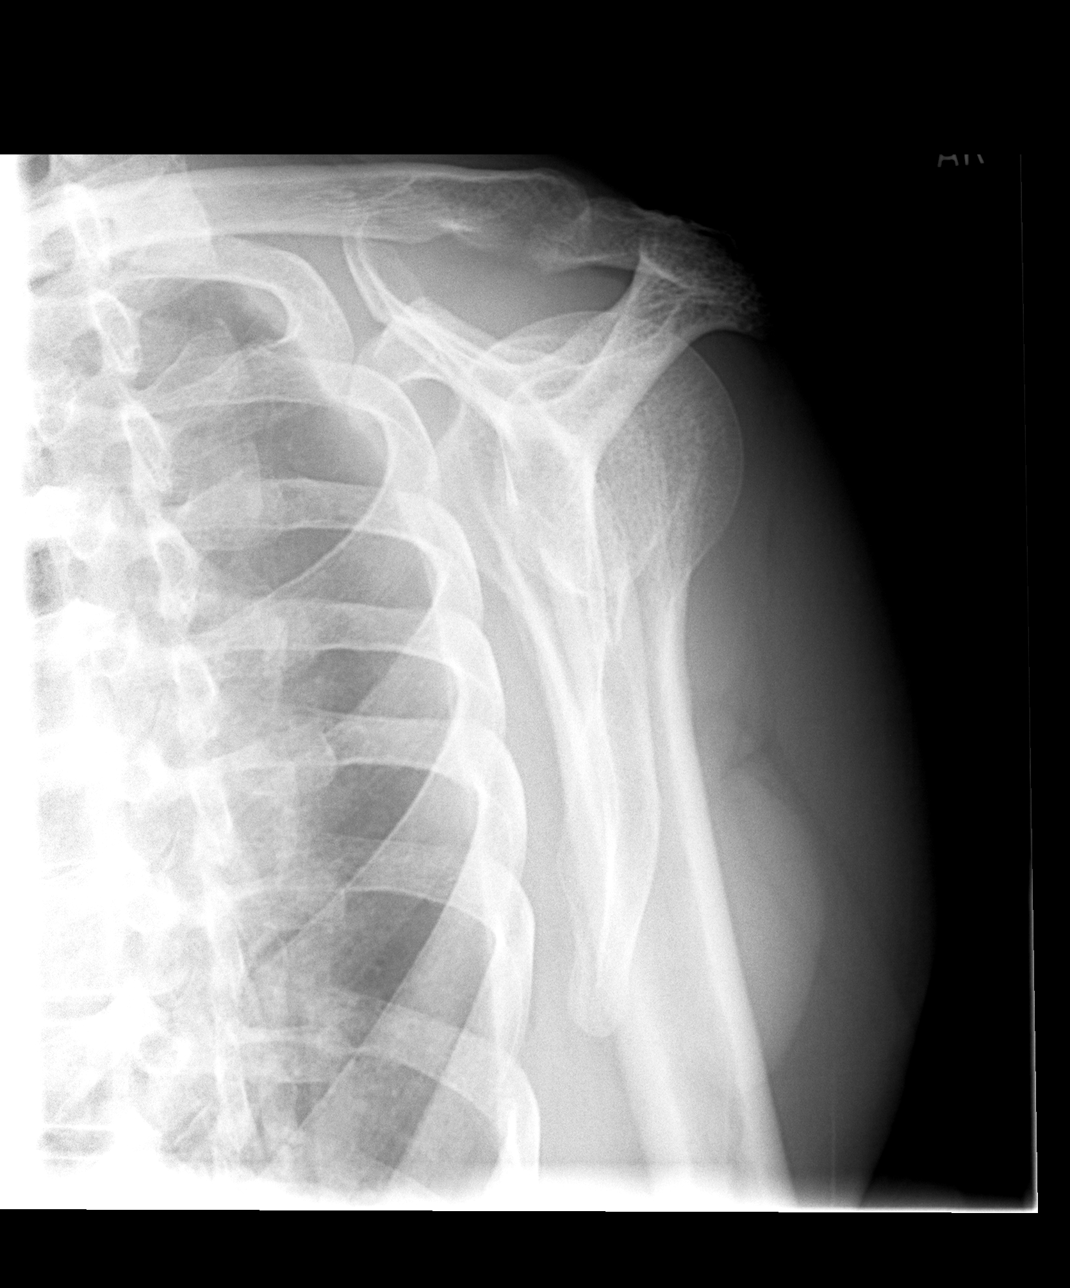

[3 of 3 positions shown; findings below may reference images not displayed]

FINDINGS: Three views of the left shoulder demonstrate no acute
displaced fracture, subluxation, dislocation, joint or soft tissue
abnormality. Joint space narrowing, subchondral sclerosis and mild
osteophyte formation are noted at the left acromioclavicular joint,
compatible with osteoarthritis.
IMPRESSION: 1.  No acute radiographic abnormality of the left shoulder.
2.  Mild left acromioclavicular joint osteoarthritis.

## 2013-08-27 ENCOUNTER — Other Ambulatory Visit: Payer: BC Managed Care – PPO | Admitting: Internal Medicine

## 2013-08-30 ENCOUNTER — Encounter: Payer: BC Managed Care – PPO | Admitting: Internal Medicine

## 2013-09-01 ENCOUNTER — Ambulatory Visit: Payer: BC Managed Care – PPO | Admitting: Internal Medicine

## 2013-10-05 ENCOUNTER — Other Ambulatory Visit: Payer: BC Managed Care – PPO | Admitting: Internal Medicine

## 2013-10-05 DIAGNOSIS — Z125 Encounter for screening for malignant neoplasm of prostate: Secondary | ICD-10-CM

## 2013-10-05 DIAGNOSIS — Z8619 Personal history of other infectious and parasitic diseases: Secondary | ICD-10-CM

## 2013-10-05 DIAGNOSIS — E785 Hyperlipidemia, unspecified: Secondary | ICD-10-CM

## 2013-10-05 DIAGNOSIS — Z130 Encounter for screening for diseases of the blood and blood-forming organs and certain disorders involving the immune mechanism: Secondary | ICD-10-CM

## 2013-10-05 LAB — CBC WITH DIFFERENTIAL/PLATELET
Basophils Absolute: 0 10*3/uL (ref 0.0–0.1)
HCT: 37.1 % — ABNORMAL LOW (ref 39.0–52.0)
Hemoglobin: 12.9 g/dL — ABNORMAL LOW (ref 13.0–17.0)
Lymphocytes Relative: 21 % (ref 12–46)
Monocytes Absolute: 0.6 10*3/uL (ref 0.1–1.0)
Monocytes Relative: 7 % (ref 3–12)
Neutro Abs: 6.1 10*3/uL (ref 1.7–7.7)
Neutrophils Relative %: 69 % (ref 43–77)
WBC: 8.8 10*3/uL (ref 4.0–10.5)

## 2013-10-05 LAB — COMPREHENSIVE METABOLIC PANEL
AST: 17 U/L (ref 0–37)
Albumin: 3.9 g/dL (ref 3.5–5.2)
BUN: 13 mg/dL (ref 6–23)
Calcium: 9 mg/dL (ref 8.4–10.5)
Chloride: 107 mEq/L (ref 96–112)
Potassium: 4.2 mEq/L (ref 3.5–5.3)
Sodium: 143 mEq/L (ref 135–145)
Total Protein: 6.1 g/dL (ref 6.0–8.3)

## 2013-10-05 LAB — LIPID PANEL: LDL Cholesterol: 131 mg/dL — ABNORMAL HIGH (ref 0–99)

## 2013-10-11 ENCOUNTER — Ambulatory Visit (INDEPENDENT_AMBULATORY_CARE_PROVIDER_SITE_OTHER): Payer: BC Managed Care – PPO | Admitting: Internal Medicine

## 2013-10-11 ENCOUNTER — Encounter: Payer: Self-pay | Admitting: Internal Medicine

## 2013-10-11 VITALS — BP 118/72 | HR 64 | Temp 97.4°F | Ht 68.0 in | Wt 151.0 lb

## 2013-10-11 DIAGNOSIS — E785 Hyperlipidemia, unspecified: Secondary | ICD-10-CM

## 2013-10-11 DIAGNOSIS — Z0000 Encounter for general adult medical examination without abnormal findings: Secondary | ICD-10-CM

## 2013-10-11 DIAGNOSIS — D369 Benign neoplasm, unspecified site: Secondary | ICD-10-CM

## 2013-10-11 DIAGNOSIS — Z23 Encounter for immunization: Secondary | ICD-10-CM

## 2013-10-11 DIAGNOSIS — Z8619 Personal history of other infectious and parasitic diseases: Secondary | ICD-10-CM

## 2013-10-11 LAB — POCT URINALYSIS DIPSTICK
Glucose, UA: NEGATIVE
Nitrite, UA: NEGATIVE
Spec Grav, UA: 1.015
Urobilinogen, UA: 0.2
pH, UA: 7.5

## 2013-12-10 ENCOUNTER — Encounter: Payer: Self-pay | Admitting: Internal Medicine

## 2013-12-10 ENCOUNTER — Ambulatory Visit (INDEPENDENT_AMBULATORY_CARE_PROVIDER_SITE_OTHER): Payer: BC Managed Care – PPO | Admitting: Internal Medicine

## 2013-12-10 VITALS — BP 128/74 | HR 76 | Temp 98.5°F | Wt 151.5 lb

## 2013-12-10 DIAGNOSIS — J069 Acute upper respiratory infection, unspecified: Secondary | ICD-10-CM

## 2013-12-10 DIAGNOSIS — N400 Enlarged prostate without lower urinary tract symptoms: Secondary | ICD-10-CM

## 2013-12-10 DIAGNOSIS — L259 Unspecified contact dermatitis, unspecified cause: Secondary | ICD-10-CM

## 2013-12-10 MED ORDER — TRIAMCINOLONE 0.1 % CREAM:EUCERIN CREAM 1:1: 1.0000 "application " | TOPICAL_CREAM | Freq: Three times a day (TID) | CUTANEOUS | Status: DC | PRN

## 2013-12-10 MED ORDER — METHYLPREDNISOLONE ACETATE 80 MG/ML IJ SUSP
80.0000 mg | Freq: Once | INTRAMUSCULAR | Status: AC
  Administered 2013-12-10: 80 mg via INTRAMUSCULAR

## 2013-12-10 NOTE — Addendum Note (Signed)
Addended by: Brett Canales on: 12/10/2013 03:26 PM   Modules accepted: Orders

## 2013-12-10 NOTE — Progress Notes (Signed)
   Subjective:    Patient ID: Robert Mora, male    DOB: [birth year], 58 y.o.   MRN: [id number]  HPI  Has had recent respiratory infection with cough and congestion that seems to be slowly resolving. However several days ago developed itchy burning rash around his neck but subsequently peeled. He is on Wellbutrin but no other medication that would cause a rash and this rash is certainly not generalized nonspecific to the neck area. Did change to a men's dial soap recently. Rash is more irritated and itchy.    Review of Systems     Objective:   Physical Exam Erythema about anterior neck. No rash on posterior neck. Appears to have some very fine papular areas about anterior neck with some scale. Pharynx is clear. TMs are clear. Neck is supple without adenopathy. Chest clear.       Assessment & Plan:  Possible contact dermatitis  Possible eczema  Protracted URI  Plan: Triamcinolone cream 0.1% in Eucerin to use sparingly 3 times a day until healed. Try Claritin 10 mg at bedtime. Depo-Medrol IM given. Depo-Medrol should help respiratory infection in addition to rash. If rash does not resolve, refer to dermatologist.

## 2013-12-10 NOTE — Patient Instructions (Addendum)
Use triamcinolone cream in Eucerin  3 times daily. Depomedrol given
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
## 2014-03-19 NOTE — Patient Instructions (Signed)
Watch diet and exercise for hyperlipidemia. Return in one year or as needed.

## 2014-05-28 ENCOUNTER — Other Ambulatory Visit: Payer: Self-pay | Admitting: Internal Medicine

## 2014-06-23 ENCOUNTER — Encounter: Payer: Self-pay | Admitting: Internal Medicine

## 2014-08-01 ENCOUNTER — Ambulatory Visit (INDEPENDENT_AMBULATORY_CARE_PROVIDER_SITE_OTHER): Payer: BC Managed Care – PPO | Admitting: Internal Medicine

## 2014-08-01 ENCOUNTER — Encounter: Payer: Self-pay | Admitting: Internal Medicine

## 2014-08-01 VITALS — BP 100/70 | HR 78 | Ht 68.0 in | Wt 151.0 lb

## 2014-08-01 DIAGNOSIS — Z23 Encounter for immunization: Secondary | ICD-10-CM

## 2014-08-01 DIAGNOSIS — L989 Disorder of the skin and subcutaneous tissue, unspecified: Secondary | ICD-10-CM

## 2014-10-02 ENCOUNTER — Encounter: Payer: Self-pay | Admitting: Internal Medicine

## 2014-10-02 NOTE — Patient Instructions (Addendum)
Reassured regarding skin lesion. Flu vaccine given.
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
## 2014-10-07 ENCOUNTER — Encounter: Payer: Self-pay | Admitting: Internal Medicine

## 2014-12-08 ENCOUNTER — Telehealth: Payer: Self-pay | Admitting: Internal Medicine

## 2014-12-08 NOTE — Telephone Encounter (Signed)
Patient states he has had issue with diarrhea; wants to make an appointment.  No fever, no bloody stools, no vomiting.  Patient has not stopped eating completely.  Has been doing potatoes, no spicy foods.  Advised patient to do liquid diet including broths, gator-ade.  No foods until diarrhea has resolved.  Advised we want his gut to rest completely.  He is to call us back on Monday if he is not better and we will have him see Dr. Renold Genta on Monday.  Patient verbalized understanding of these instructions.

## 2014-12-12 ENCOUNTER — Ambulatory Visit (INDEPENDENT_AMBULATORY_CARE_PROVIDER_SITE_OTHER): Payer: BLUE CROSS/BLUE SHIELD | Admitting: Internal Medicine

## 2014-12-12 ENCOUNTER — Encounter: Payer: Self-pay | Admitting: Internal Medicine

## 2014-12-12 VITALS — BP 106/64 | HR 89 | Temp 97.9°F

## 2014-12-12 DIAGNOSIS — R197 Diarrhea, unspecified: Secondary | ICD-10-CM

## 2014-12-12 MED ORDER — TRIAMCINOLONE 0.1 % CREAM:EUCERIN CREAM 1:1: 1.0000 "application " | TOPICAL_CREAM | Freq: Three times a day (TID) | CUTANEOUS | Status: DC | PRN

## 2014-12-12 MED ORDER — CIPROFLOXACIN HCL 500 MG PO TABS: 500.0000 mg | ORAL_TABLET | Freq: Two times a day (BID) | ORAL | Status: DC

## 2014-12-12 NOTE — Progress Notes (Signed)
   Subjective:    Patient ID: Robert Mora, male    DOB: 1956-01-09, 59 y.o.   MRN: [id number]  HPI  Here today with a 3 week history of diarrhea. Remembers eating vegetable plate with deviled egg, egg roll containing collards and other vegetables about 3 weeks ago at a restaurant nearby. Diarrhea started fairly suddenly. He's had 5 or 6 episodes a day. No blood in stool. No fever or shaking chills. No one else at home is ill. He has no nausea or vomiting. Has noticed some rumbling in his abdomen. He called last week and was told to try clear liquid diet which really hasn't helped all that much and he's tired of that diet. No abdominal pain. No travel history out of the country. No well water consumption. No daycare exposure.  Also needs refill on triamcinolone/ Eucerin cream combination that he uses on scar on his leg. Feels that has helped a great deal.    Review of Systems     Objective:   Physical Exam  Abdomen is soft without hepatosplenomegaly masses or tenderness.      Assessment & Plan:  Diarrhea-etiology unclear. Could be viral syndrome but one would think that would be slowly improving after 3 weeks. Possibly could have bacterial gastroenteritis.  Plan: Cipro 500 mg twice daily for 3 days. If symptoms do not improve, may need to obtain stool studies and evaluate further.  Recommend that he stay on clear liquids and advance to full liquids and then soft diet over the next week

## 2014-12-12 NOTE — Patient Instructions (Signed)
Take Cipro 500 mg twice a day for 3 days. Advance diet slowly from liquid to full to soft diet. Call if not better in one week or sooner if worse.

## 2014-12-19 ENCOUNTER — Telehealth: Payer: Self-pay | Admitting: Internal Medicine

## 2014-12-19 DIAGNOSIS — R197 Diarrhea, unspecified: Secondary | ICD-10-CM

## 2014-12-19 NOTE — Telephone Encounter (Signed)
You saw patient on 2/1 for diarrhea and placed him on Cipro 500 mg x 3 days.  He did well for a few days afterwards.  States he now has it again.  Do you want to see him again in the office?  Or, do you want to do stool studies as you mention in your note from 2/1?  Patient advised he is not on clear liquid diet just yet.  Advised he's on soft diet.  Advised to begin clear liquid diet and wait to hear back from our office.

## 2014-12-19 NOTE — Telephone Encounter (Signed)
He needs stool studies and referral to GI. Please call for appt. With [facility]

## 2014-12-21 MED ORDER — CIPROFLOXACIN HCL 500 MG PO TABS: 500.0000 mg | ORAL_TABLET | Freq: Two times a day (BID) | ORAL | Status: DC

## 2014-12-21 MED ORDER — METRONIDAZOLE 500 MG PO TABS: 500.0000 mg | ORAL_TABLET | Freq: Two times a day (BID) | ORAL | Status: DC

## 2014-12-21 NOTE — Telephone Encounter (Signed)
Scheduled patient with Janett Billow at [place] . Patient appt is 12/23/2014 patient needs to be there at 9:40am Left message at patient home number to call us to get information about appt

## 2014-12-21 NOTE — Telephone Encounter (Signed)
Dr Renold Genta spoke with patient let him know we need to recollect stool sample . We sent in scripts for Cipro 500 mg and Flagyl 500mg  we will rcancel GI appt for now and re evaluate symptoms next week

## 2014-12-22 ENCOUNTER — Telehealth: Payer: Self-pay | Admitting: *Deleted

## 2014-12-22 DIAGNOSIS — R197 Diarrhea, unspecified: Secondary | ICD-10-CM

## 2014-12-22 NOTE — Telephone Encounter (Signed)
Patient dropped off stool samples to be sent to lab

## 2014-12-23 ENCOUNTER — Ambulatory Visit: Payer: PRIVATE HEALTH INSURANCE | Admitting: Gastroenterology

## 2014-12-23 LAB — FECAL LACTOFERRIN, QUANT: LACTOFERRIN: POSITIVE

## 2014-12-23 LAB — OVA AND PARASITE EXAMINATION

## 2014-12-23 LAB — STOOL CELLS, WBC & RBC
RBC/40X FIELD:: 0
WBC/40X FIELD (SOL): 0

## 2014-12-26 ENCOUNTER — Telehealth: Payer: Self-pay | Admitting: Internal Medicine

## 2014-12-26 LAB — STOOL CULTURE

## 2014-12-26 NOTE — Telephone Encounter (Signed)
Have called pt to let him know that stool O and P shows Giardia. He is already on Flagyl which should treat this. Needs to avaoid lactose products for 4 weeks as there may be a transient lactose intolerance.

## 2014-12-27 LAB — CLOSTRIDIUM DIFFICILE CULTURE-FECAL

## 2014-12-30 ENCOUNTER — Encounter: Payer: Self-pay | Admitting: Internal Medicine

## 2014-12-30 ENCOUNTER — Telehealth: Payer: Self-pay | Admitting: Internal Medicine

## 2014-12-30 NOTE — Telephone Encounter (Signed)
Pt called to say he has had no diarrhea for 2 days s/p treatment for Giardia.Will call back next week for CPE appt. Last 2014

## 2015-06-03 ENCOUNTER — Other Ambulatory Visit: Payer: Self-pay | Admitting: Internal Medicine

## 2015-07-10 ENCOUNTER — Other Ambulatory Visit: Payer: Self-pay | Admitting: Internal Medicine

## 2015-07-21 ENCOUNTER — Other Ambulatory Visit: Payer: Self-pay | Admitting: Internal Medicine

## 2015-07-21 ENCOUNTER — Other Ambulatory Visit: Payer: BLUE CROSS/BLUE SHIELD | Admitting: Internal Medicine

## 2015-07-21 DIAGNOSIS — Z0000 Encounter for general adult medical examination without abnormal findings: Secondary | ICD-10-CM

## 2015-07-21 DIAGNOSIS — Z13220 Encounter for screening for lipoid disorders: Secondary | ICD-10-CM

## 2015-07-21 DIAGNOSIS — Z125 Encounter for screening for malignant neoplasm of prostate: Secondary | ICD-10-CM

## 2015-07-21 LAB — COMPLETE METABOLIC PANEL WITH GFR
ALT: 15 U/L (ref 9–46)
AST: 25 U/L (ref 10–35)
Albumin: 3.6 g/dL (ref 3.6–5.1)
Alkaline Phosphatase: 75 U/L (ref 40–115)
BUN: 18 mg/dL (ref 7–25)
CALCIUM: 8.6 mg/dL (ref 8.6–10.3)
CHLORIDE: 108 mmol/L (ref 98–110)
CO2: 26 mmol/L (ref 20–31)
CREATININE: 0.89 mg/dL (ref 0.70–1.25)
GFR, Est African American: >89 mL/min (ref >=60)
GFR, Est Non African American: >89 mL/min (ref >=60)
GLUCOSE: 99 mg/dL (ref 65–99)
POTASSIUM: 4.3 mmol/L (ref 3.5–5.3)
SODIUM: 142 mmol/L (ref 135–146)
Total Bilirubin: 0.4 mg/dL (ref 0.2–1.2)
Total Protein: 6.1 g/dL (ref 6.1–8.1)

## 2015-07-21 LAB — CBC WITH DIFFERENTIAL/PLATELET
BASOS ABS: 0 10*3/uL (ref 0.0–0.1)
Basophils Relative: 0 % (ref 0–1)
EOS ABS: 0.2 10*3/uL (ref 0.0–0.7)
Eosinophils Relative: 2 % (ref 0–5)
HCT: 37 % — ABNORMAL LOW (ref 39.0–52.0)
Hemoglobin: 12.8 g/dL — ABNORMAL LOW (ref 13.0–17.0)
LYMPHS ABS: 2 10*3/uL (ref 0.7–4.0)
LYMPHS PCT: 21 % (ref 12–46)
MCH: 32.1 pg (ref 26.0–34.0)
MCHC: 34.6 g/dL (ref 30.0–36.0)
MCV: 92.7 fL (ref 78.0–100.0)
MPV: 10.3 fL (ref 8.6–12.4)
Monocytes Absolute: 0.8 10*3/uL (ref 0.1–1.0)
Monocytes Relative: 8 % (ref 3–12)
Neutro Abs: 6.5 10*3/uL (ref 1.7–7.7)
Neutrophils Relative %: 69 % (ref 43–77)
PLATELETS: 180 10*3/uL (ref 150–400)
RBC: 3.99 MIL/uL — AB (ref 4.22–5.81)
RDW: 13.5 % (ref 11.5–15.5)
WBC: 9.4 10*3/uL (ref 4.0–10.5)

## 2015-07-21 LAB — LIPID PANEL
CHOLESTEROL: 215 mg/dL — AB (ref 125–200)
HDL: 43 mg/dL (ref >=40)
LDL Cholesterol: 150 mg/dL — ABNORMAL HIGH (ref <130)
Total CHOL/HDL Ratio: 5 Ratio (ref <=5.0)
Triglycerides: 111 mg/dL (ref <150)
VLDL: 22 mg/dL (ref <30)

## 2015-07-22 LAB — PSA: PSA: 0.43 ng/mL (ref <=4.00)

## 2015-07-24 ENCOUNTER — Ambulatory Visit (INDEPENDENT_AMBULATORY_CARE_PROVIDER_SITE_OTHER): Payer: BLUE CROSS/BLUE SHIELD | Admitting: Internal Medicine

## 2015-07-24 ENCOUNTER — Encounter: Payer: Self-pay | Admitting: Internal Medicine

## 2015-07-24 VITALS — BP 122/76 | HR 76 | Temp 98.0°F | Ht 68.0 in | Wt 156.5 lb

## 2015-07-24 DIAGNOSIS — E785 Hyperlipidemia, unspecified: Secondary | ICD-10-CM

## 2015-07-24 DIAGNOSIS — Z0000 Encounter for general adult medical examination without abnormal findings: Secondary | ICD-10-CM

## 2015-07-24 DIAGNOSIS — Z23 Encounter for immunization: Secondary | ICD-10-CM

## 2015-07-24 LAB — POCT URINALYSIS DIPSTICK
BILIRUBIN UA: NEGATIVE
Blood, UA: NEGATIVE
Glucose, UA: NEGATIVE
KETONES UA: NEGATIVE
LEUKOCYTES UA: NEGATIVE
Nitrite, UA: NEGATIVE
Protein, UA: NEGATIVE
Spec Grav, UA: 1.015
Urobilinogen, UA: NEGATIVE
pH, UA: 6.5
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
## 2015-07-24 NOTE — Patient Instructions (Signed)
It was pleasure to see you today. Continue Wellbutrin. HIV and Hep C studies pending. Return in 6 months for fasting lipid panel no liver functions. Watch diet. Flu vaccine given.

## 2015-07-25 LAB — HIV ANTIBODY (ROUTINE TESTING W REFLEX): HIV: NONREACTIVE

## 2015-07-25 LAB — HEPATITIS C ANTIBODY: HCV AB: NEGATIVE

## 2015-09-04 ENCOUNTER — Other Ambulatory Visit: Payer: Self-pay

## 2015-09-04 MED ORDER — BUPROPION HCL ER (XL) 300 MG PO TB24: 300.0000 mg | ORAL_TABLET | Freq: Every day | ORAL | Status: DC

## 2015-09-04 NOTE — Telephone Encounter (Signed)
90 day supply of bupropion 300mg  sent to [facility].

## 2015-10-09 ENCOUNTER — Encounter: Payer: Self-pay | Admitting: Internal Medicine

## 2015-10-16 ENCOUNTER — Encounter: Payer: Self-pay | Admitting: Internal Medicine

## 2015-10-16 ENCOUNTER — Ambulatory Visit (INDEPENDENT_AMBULATORY_CARE_PROVIDER_SITE_OTHER): Payer: BLUE CROSS/BLUE SHIELD | Admitting: Internal Medicine

## 2015-10-16 VITALS — BP 114/70 | HR 72 | Temp 97.6°F | Resp 20 | Ht 68.0 in | Wt 165.5 lb

## 2015-10-16 DIAGNOSIS — L0100 Impetigo, unspecified: Secondary | ICD-10-CM

## 2015-10-16 MED ORDER — MUPIROCIN 2 % EX OINT: 1.0000 "application " | TOPICAL_OINTMENT | Freq: Two times a day (BID) | CUTANEOUS | Status: DC

## 2015-10-16 MED ORDER — DOXYCYCLINE HYCLATE 100 MG PO TABS: 100.0000 mg | ORAL_TABLET | Freq: Two times a day (BID) | ORAL | Status: DC
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
## 2015-11-11 NOTE — Patient Instructions (Signed)
Use Bactroban ointment on leg twice daily. Doxycycline 100 mg twice daily for 10 days.

## 2015-11-23 ENCOUNTER — Encounter: Payer: Self-pay | Admitting: Internal Medicine

## 2016-01-19 ENCOUNTER — Other Ambulatory Visit: Payer: BLUE CROSS/BLUE SHIELD | Admitting: Internal Medicine

## 2016-01-19 ENCOUNTER — Other Ambulatory Visit: Payer: Self-pay | Admitting: Internal Medicine

## 2016-01-19 DIAGNOSIS — E785 Hyperlipidemia, unspecified: Secondary | ICD-10-CM

## 2016-01-19 DIAGNOSIS — Z79899 Other long term (current) drug therapy: Secondary | ICD-10-CM

## 2016-01-19 LAB — LIPID PANEL
CHOL/HDL RATIO: 4.6 ratio (ref <=5.0)
CHOLESTEROL: 214 mg/dL — AB (ref 125–200)
HDL: 47 mg/dL (ref >=40)
LDL CALC: 148 mg/dL — AB (ref <130)
Triglycerides: 95 mg/dL (ref <150)
VLDL: 19 mg/dL (ref <30)

## 2016-01-19 LAB — HEPATIC FUNCTION PANEL
ALBUMIN: 3.7 g/dL (ref 3.6–5.1)
ALK PHOS: 68 U/L (ref 40–115)
ALT: 14 U/L (ref 9–46)
AST: 20 U/L (ref 10–35)
BILIRUBIN DIRECT: 0.1 mg/dL (ref <=0.2)
BILIRUBIN TOTAL: 0.4 mg/dL (ref 0.2–1.2)
Indirect Bilirubin: 0.3 mg/dL (ref 0.2–1.2)
Total Protein: 6.1 g/dL (ref 6.1–8.1)

## 2016-01-22 ENCOUNTER — Encounter: Payer: Self-pay | Admitting: Internal Medicine

## 2016-01-22 ENCOUNTER — Ambulatory Visit (INDEPENDENT_AMBULATORY_CARE_PROVIDER_SITE_OTHER): Payer: BLUE CROSS/BLUE SHIELD | Admitting: Internal Medicine

## 2016-01-22 VITALS — BP 118/70 | HR 75 | Temp 97.6°F | Resp 18 | Ht 68.0 in | Wt 158.0 lb

## 2016-01-22 DIAGNOSIS — E785 Hyperlipidemia, unspecified: Secondary | ICD-10-CM | POA: Diagnosis not present

## 2016-01-22 DIAGNOSIS — E7800 Pure hypercholesterolemia, unspecified: Secondary | ICD-10-CM

## 2016-01-22 MED ORDER — ROSUVASTATIN CALCIUM 5 MG PO TABS: 5.0000 mg | ORAL_TABLET | Freq: Every day | ORAL | Status: DC

## 2016-01-22 NOTE — Patient Instructions (Signed)
He will start Crestor 5 mg daily and return for lipid panel liver functions without office visit in 3 months.

## 2016-01-22 NOTE — Progress Notes (Signed)
   Subjective:    Patient ID: Robert Mora, male    DOB: 1956/04/06, 60 y.o.   MRN: [id number]  HPI He is here today to follow-up on hyperlipidemia. History of elevated LDL cholesterol. Total cholesterol is 214 and was 2:15 6 months ago. Triglycerides are 95 and were 111 6 months ago. LDL cholesterol is 148 and was 156 months ago. HDL is 47 and was 43 6 months ago. He hasn't made a lot of progress with his LDL with diet and exercise. He did try Normajean Baxter recently and thought he lost a little bit of weight.  He is also wanting to have testosterone checked and we can add that to the previous lab work that was drawn this past Friday. He was screened for hepatitis C and HIV with his last physical and these were negative.  Having some issues with senile purpura on his hands and arms  Review of Systems     Objective:   Physical Exam  Purpura noted on hands and arms-- minor amounts both arms and hands      Assessment & Plan:  Hyperlipidemia-family history both parents. Father had history of stroke.  Senile purpura  Plan: Have recommended 81 mg of aspirin daily was family history of stroke and hyperlipidemia. It may aggravate senile purpura. One start Crestor 5 mg daily. In 3 months he'll have lipid panel liver functions without office visit. We are  also going to check testosterone level at his request. Physical exam will be due in 6 months.

## 2016-01-23 ENCOUNTER — Ambulatory Visit (AMBULATORY_SURGERY_CENTER): Payer: Self-pay | Admitting: *Deleted

## 2016-01-23 VITALS — Ht 68.0 in | Wt 159.0 lb

## 2016-01-23 DIAGNOSIS — Z86010 Personal history of colonic polyps: Secondary | ICD-10-CM

## 2016-01-23 LAB — TESTOSTERONE: TESTOSTERONE: 426 ng/dL (ref 250–827)

## 2016-01-23 NOTE — Progress Notes (Signed)
No egg or soy allergy known to patient  No issues with past sedation with any surgeries  or procedures, no intubation problems  No diet pills per patient No home 02 use per patient  No blood thinners per patient  Pt denies issues with constipation   

## 2016-02-06 ENCOUNTER — Encounter: Payer: Self-pay | Admitting: Internal Medicine

## 2016-02-06 ENCOUNTER — Ambulatory Visit (AMBULATORY_SURGERY_CENTER): Payer: BLUE CROSS/BLUE SHIELD | Admitting: Internal Medicine

## 2016-02-06 VITALS — BP 106/49 | HR 60 | Temp 98.9°F | Resp 14 | Ht 68.0 in | Wt 158.0 lb

## 2016-02-06 DIAGNOSIS — Z86010 Personal history of colonic polyps: Secondary | ICD-10-CM

## 2016-02-06 DIAGNOSIS — D124 Benign neoplasm of descending colon: Secondary | ICD-10-CM

## 2016-02-06 MED ORDER — SODIUM CHLORIDE 0.9 % IV SOLN: 500.0000 mL | INTRAVENOUS | Status: DC

## 2016-02-06 NOTE — Op Note (Signed)
[facility] Patient Name: Robert Mora Procedure Date: 02/06/2016 11:54 AM MRN: [id number] Endoscopist: Gatha Mayer , MD Age: 60 Referring MD:  Date of Birth: 1956/05/30 Gender: Male Procedure:                Colonoscopy Indications:              Surveillance: Personal history of adenomatous                            polyps on last colonoscopy > 5 years ago Medicines:                Propofol total dose 350 mg IV, Monitored Anesthesia                            Care Procedure:                Pre-Anesthesia Assessment:                           - Prior to the procedure, a History and Physical                            was performed, and patient medications and                            allergies were reviewed. The patient's tolerance of                            previous anesthesia was also reviewed. The risks                            and benefits of the procedure and the sedation                            options and risks were discussed with the patient.                            All questions were answered, and informed consent                            was obtained. Prior Anticoagulants: The patient has                            taken no previous anticoagulant or antiplatelet                            agents. ASA Grade Assessment: II - A patient with                            mild systemic disease. After reviewing the risks                            and benefits, the patient was deemed in  satisfactory condition to undergo the procedure.                           After obtaining informed consent, the colonoscope                            was passed under direct vision. Throughout the                            procedure, the patient's blood pressure, pulse, and                            oxygen saturations were monitored continuously. The                            Model CF-HQ190L [id number]) scope was introduced               through the anus and advanced to the the cecum,                            identified by appendiceal orifice and ileocecal                            valve. The colonoscopy was performed without                            difficulty. The patient tolerated the procedure                            well. The quality of the bowel preparation was                            good. The bowel preparation used was Miralax. The                            ileocecal valve, appendiceal orifice, and rectum                            were photographed. Scope In: 12:03:48 PM Scope Out: 12:21:45 PM Scope Withdrawal Time: 0 hours 15 minutes 31 seconds  Total Procedure Duration: 0 hours 17 minutes 57 seconds  Findings:      A 5 mm polyp was found in the proximal descending colon. The polyp was       sessile. The polyp was removed with a cold snare. Resection and       retrieval were complete. Verification of patient identification for the       specimen was done. Estimated blood loss: none.      Multiple small and large-mouthed diverticula were found in the sigmoid       colon.      The exam was otherwise without abnormality on direct and retroflexion       views.      The perianal and digital rectal examinations were normal. Pertinent       negatives include normal prostate (size, shape, and consistency). Complications:  No immediate complications. Estimated Blood Loss:     Estimated blood loss: none. Impression:               - One 5 mm polyp in the proximal descending colon,                            removed with a cold snare. Resected and retrieved.                           - Diverticulosis in the sigmoid colon.                           - The examination was otherwise normal on direct                            and retroflexion views. Recommendation:           - Patient has a contact number available for                            emergencies. The signs and symptoms of  potential                            delayed complications were discussed with the                            patient. Return to normal activities tomorrow.                            Written discharge instructions were provided to the                            patient.                           - Resume previous diet.                           - Continue present medications.                           - Repeat colonoscopy is recommended for                            surveillance. The colonoscopy date will be                            determined after pathology results from today's                            exam become available for review. Procedure Code(s):        --- Professional ---                           [phone], Colonoscopy, flexible; with removal of  tumor(s), polyp(s), or other lesion(s) by snare                            technique CPT copyright 2016 American Medical Association. All rights reserved. Gatha Mayer, MD 02/06/2016 12:30:00 PM This report has been signed electronically. Number of Addenda: 0 Referring MD:      Cresenciano Lick. Baxley

## 2016-02-06 NOTE — Patient Instructions (Addendum)
 I found and removed 2 tiny polyps that look benign. I will let you know pathology results and when to have another routine colonoscopy by mail.  You also have a condition called diverticulosis - common and not usually a problem. Please read the handout provided.  I appreciate the opportunity to care for you. Wynne Jury E. Hitesh Fouche, MD, FACG  Discharge instructions given. Handouts on polyps and diverticulosis. Resume previous medications. YOU HAD AN ENDOSCOPIC PROCEDURE TODAY AT THE [facility]:   Refer to the procedure report that was given to you for any specific questions about what was found during the examination.  If the procedure report does not answer your questions, please call your gastroenterologist to clarify.  If you requested that your care partner not be given the details of your procedure findings, then the procedure report has been included in a sealed envelope for you to review at your convenience later.  YOU SHOULD EXPECT: Some feelings of bloating in the abdomen. Passage of more gas than usual.  Walking can help get rid of the air that was put into your GI tract during the procedure and reduce the bloating. If you had a lower endoscopy (such as a colonoscopy or flexible sigmoidoscopy) you may notice spotting of blood in your stool or on the toilet paper. If you underwent a bowel prep for your procedure, you may not have a normal bowel movement for a few days.  Please Note:  You might notice some irritation and congestion in your nose or some drainage.  This is from the oxygen used during your procedure.  There is no need for concern and it should clear up in a day or so.  SYMPTOMS TO REPORT IMMEDIATELY:   Following lower endoscopy (colonoscopy or flexible sigmoidoscopy):  Excessive amounts of blood in the stool  Significant tenderness or worsening of abdominal pains  Swelling of the abdomen that is new, acute  Fever of 100F or higher   For urgent or  emergent issues, a gastroenterologist can be reached at any hour by calling [phone].   DIET: Your first meal following the procedure should be a small meal and then it is ok to progress to your normal diet. Heavy or fried foods are harder to digest and may make you feel nauseous or bloated.  Likewise, meals heavy in dairy and vegetables can increase bloating.  Drink plenty of fluids but you should avoid alcoholic beverages for 24 hours.  ACTIVITY:  You should plan to take it easy for the rest of today and you should NOT DRIVE or use heavy machinery until tomorrow (because of the sedation medicines used during the test).    FOLLOW UP: Our staff will call the number listed on your records the next business day following your procedure to check on you and address any questions or concerns that you may have regarding the information given to you following your procedure. If we do not reach you, we will leave a message.  However, if you are feeling well and you are not experiencing any problems, there is no need to return our call.  We will assume that you have returned to your regular daily activities without incident.  If any biopsies were taken you will be contacted by phone or by letter within the next 1-3 weeks.  Please call us at [phone] if you have not heard about the biopsies in 3 weeks.    SIGNATURES/CONFIDENTIALITY: You and/or your care partner have signed paperwork   paperwork which will be entered into your electronic medical record.  These signatures attest to the fact that that the information above on your After Visit Summary has been reviewed and is understood.  Full responsibility of the confidentiality of this discharge information lies with you and/or your care-partner.

## 2016-02-06 NOTE — Progress Notes (Signed)
A and Ox 3 Report to RN 

## 2016-02-06 NOTE — Progress Notes (Signed)
Called to room to assist during endoscopic procedure.  Patient ID and intended procedure confirmed with present staff. Received instructions for my participation in the procedure from the performing physician.  

## 2016-02-07 ENCOUNTER — Telehealth: Payer: Self-pay | Admitting: *Deleted

## 2016-02-07 NOTE — Telephone Encounter (Signed)
  Follow up Call-  Call back number 02/06/2016  Post procedure Call Back phone  # [phone]  Permission to leave phone message Yes     No answer, left message.

## 2016-02-15 ENCOUNTER — Encounter: Payer: Self-pay | Admitting: Internal Medicine

## 2016-02-15 DIAGNOSIS — Z86010 Personal history of colonic polyps: Secondary | ICD-10-CM

## 2016-02-15 NOTE — Progress Notes (Signed)
Quick Note:  One adenoma - recall 2022 ______

## 2016-02-20 ENCOUNTER — Telehealth: Payer: Self-pay | Admitting: Internal Medicine

## 2016-02-20 ENCOUNTER — Ambulatory Visit
Admission: RE | Admit: 2016-02-20 | Discharge: 2016-02-20 | Disposition: A | Discharge status: Home / Self Care | Payer: BLUE CROSS/BLUE SHIELD | Source: Ambulatory Visit | Attending: Internal Medicine | Admitting: Internal Medicine

## 2016-02-20 ENCOUNTER — Ambulatory Visit (INDEPENDENT_AMBULATORY_CARE_PROVIDER_SITE_OTHER): Payer: BLUE CROSS/BLUE SHIELD | Admitting: Internal Medicine

## 2016-02-20 ENCOUNTER — Encounter: Payer: Self-pay | Admitting: Internal Medicine

## 2016-02-20 VITALS — BP 124/64 | HR 72 | Temp 97.5°F | Resp 18 | Wt 155.0 lb

## 2016-02-20 DIAGNOSIS — R1032 Left lower quadrant pain: Secondary | ICD-10-CM

## 2016-02-20 LAB — CBC WITH DIFFERENTIAL/PLATELET
BASOS PCT: 0 %
Basophils Absolute: 0 cells/uL (ref 0–200)
EOS PCT: 2 %
Eosinophils Absolute: 140 cells/uL (ref 15–500)
HEMATOCRIT: 38.3 % — AB (ref 38.5–50.0)
HEMOGLOBIN: 13.1 g/dL — AB (ref 13.2–17.1)
LYMPHS ABS: 1470 {cells}/uL (ref 850–3900)
Lymphocytes Relative: 21 %
MCH: 31.6 pg (ref 27.0–33.0)
MCHC: 34.2 g/dL (ref 32.0–36.0)
MCV: 92.5 fL (ref 80.0–100.0)
MONO ABS: 560 {cells}/uL (ref 200–950)
MPV: 10.4 fL (ref 7.5–12.5)
Monocytes Relative: 8 %
NEUTROS ABS: 4830 {cells}/uL (ref 1500–7800)
Neutrophils Relative %: 69 %
Platelets: 196 10*3/uL (ref 140–400)
RBC: 4.14 MIL/uL — AB (ref 4.20–5.80)
RDW: 13.1 % (ref 11.0–15.0)
WBC: 7 10*3/uL (ref 3.8–10.8)

## 2016-02-20 LAB — HEMOCCULT GUIAC POC 1CARD (OFFICE): Fecal Occult Blood, POC: POSITIVE — AB

## 2016-02-20 MED ORDER — HYDROCORTISONE ACETATE 25 MG RE SUPP: 25.0000 mg | Freq: Two times a day (BID) | RECTAL | Status: DC

## 2016-02-20 MED ORDER — METRONIDAZOLE 500 MG PO TABS: 500.0000 mg | ORAL_TABLET | Freq: Two times a day (BID) | ORAL | Status: DC

## 2016-02-20 MED ORDER — CIPROFLOXACIN HCL 500 MG PO TABS: 500.0000 mg | ORAL_TABLET | Freq: Two times a day (BID) | ORAL | Status: DC

## 2016-02-20 MED ORDER — IOPAMIDOL (ISOVUE-300) INJECTION 61%
100.0000 mL | Freq: Once | INTRAVENOUS | Status: AC | PRN
  Administered 2016-02-20: 100 mL via INTRAVENOUS

## 2016-02-20 NOTE — Progress Notes (Signed)
   Subjective:    Patient ID: Robert Mora, male    DOB: 01-21-1956, 60 y.o.   MRN: [id number]  HPI Patient underwent screening colonoscopy March 28. He was found to have 2 small polyps in descending colon. Pathology showed 2 tubular adenomas with no high-grade dysplasia. Last Thursday, April 6, he began to have some lower abdominal pain. He had foul-smelling mucus with some blood. Stools had decreased caliber. He felt constipated. It was painful to have a bowel movement. Last night he had low-grade fever. No shaking chills. He has had appendectomy in the remote past.    Review of Systems as above     Objective:   Physical Exam Skin warm and dry. Abdomen bowel sounds are present. Abdomen is not distended. He has no hepatosplenomegaly or masses appreciated. However he stent or in his right lower quadrant but more so in the left lower quadrant with rebound tenderness. Anoscopy shows prominent internal hemorrhoids. Stool is guaiac positive.       Assessment & Plan:  Probable acute diverticulitis  Internal hemorrhoids  Plan: CBC with DEF and CT of the abdomen and pelvis ordered for further evaluation.

## 2016-02-20 NOTE — Addendum Note (Signed)
Addended by: Beryle Quant on: 02/20/2016 02:41 PM   Modules accepted: Orders

## 2016-02-20 NOTE — Telephone Encounter (Signed)
Patient called to verify medications at his pharmacy.  He didn't want to pick up the wrong meds.  Advised that his CT scan was negative.  No diverticulitis.  However, due to abdominal pain and rectal bleeding, Dr. Renold Genta will treat with antibiotics with patient's history as well.    Flagyl 500mg  BID x 7 days. Cipro 500mg  BID x 7 days. Anusol HC rectally BID #12 Colace Stool Softener 100mg  qhs daily  Diet:  Clear liguids x 3-4 days.  Advance diet slowly.  Patient verbalized understanding of these instructions.  Patient advised to call us back with any questions.  We're here through Thursday.

## 2016-02-20 NOTE — Patient Instructions (Signed)
Please have CT of the abdomen and pelvis today. CBC with differential pending. Further instructions to follow.

## 2016-02-20 NOTE — Addendum Note (Signed)
Addended by: Beryle Quant on: 02/20/2016 12:55 PM   Modules accepted: Orders

## 2016-02-26 ENCOUNTER — Other Ambulatory Visit: Payer: Self-pay | Admitting: Internal Medicine

## 2016-03-05 ENCOUNTER — Encounter: Payer: Self-pay | Admitting: Internal Medicine

## 2016-03-05 ENCOUNTER — Other Ambulatory Visit: Payer: Self-pay | Admitting: Internal Medicine

## 2016-03-05 ENCOUNTER — Ambulatory Visit (INDEPENDENT_AMBULATORY_CARE_PROVIDER_SITE_OTHER): Payer: BLUE CROSS/BLUE SHIELD | Admitting: Internal Medicine

## 2016-03-05 VITALS — BP 108/64 | HR 67 | Temp 97.4°F | Resp 20 | Wt 155.0 lb

## 2016-03-05 DIAGNOSIS — K648 Other hemorrhoids: Secondary | ICD-10-CM | POA: Diagnosis not present

## 2016-03-05 DIAGNOSIS — R3911 Hesitancy of micturition: Secondary | ICD-10-CM | POA: Diagnosis not present

## 2016-03-05 DIAGNOSIS — R300 Dysuria: Secondary | ICD-10-CM

## 2016-03-05 DIAGNOSIS — N410 Acute prostatitis: Secondary | ICD-10-CM | POA: Diagnosis not present

## 2016-03-05 DIAGNOSIS — K644 Residual hemorrhoidal skin tags: Secondary | ICD-10-CM

## 2016-03-05 LAB — POCT URINALYSIS DIPSTICK
Bilirubin, UA: NEGATIVE
Glucose, UA: NEGATIVE
Ketones, UA: NEGATIVE
LEUKOCYTES UA: NEGATIVE
Nitrite, UA: NEGATIVE
PROTEIN UA: NEGATIVE
Spec Grav, UA: 1.02
UROBILINOGEN UA: 0.2
pH, UA: 7

## 2016-03-05 MED ORDER — DOXYCYCLINE HYCLATE 100 MG PO TABS: 100.0000 mg | ORAL_TABLET | Freq: Two times a day (BID) | ORAL | Status: DC

## 2016-03-05 MED ORDER — HYDROCORTISONE 2.5 % RE CREA: 1.0000 "application " | TOPICAL_CREAM | Freq: Two times a day (BID) | RECTAL | Status: DC

## 2016-03-06 LAB — GC/CHLAMYDIA PROBE AMP
CT PROBE, AMP APTIMA: NOT DETECTED
GC PROBE AMP APTIMA: NOT DETECTED

## 2016-03-06 NOTE — Progress Notes (Signed)
   Subjective:    Patient ID: Robert Mora, male    DOB: 1956-07-09, 60 y.o.   MRN: [id number]  HPI Patient was seen here recently after a colonoscopy complaining of issues with hemorrhoids and passing mucus. He was treated with Cipro and Flagyl in says symptoms improved up until a few days ago. He's had more mucus in his stool. He's also noticed some tenderness externally in the rectal area. He's concerned about an STD. We checked GC and chlamydia today by urine. Partner has expressed some interest in pursuing some other relationships outside of years. This has worried him.  He's noticed some urinary hesitancy recently. This is a new symptom.    Review of Systems see above     Objective:   Physical Exam He has a small external hemorrhoid. No fissures noted. Rectal exam reveals boggy prostate. Urine dipstick shows moderate occult blood but he has shown occult blood in his urine before. Urine will be cultured.       Assessment & Plan:  External hemorrhoid  Prostatitis  Plan: Doxycycline 100 mg twice daily for 2 weeks. Anusol 2.5% rectal cream to rectal area 2-4 times daily. Sits baths.

## 2016-03-06 NOTE — Patient Instructions (Addendum)
Take doxycycline 100 mg twice daily for 14 days. Use Anusol rectal cream in rectal area 2-4 times daily until healed. Sitz baths.

## 2016-03-08 LAB — CULTURE, URINE COMPREHENSIVE

## 2016-03-27 ENCOUNTER — Other Ambulatory Visit: Payer: Self-pay | Admitting: Internal Medicine

## 2016-04-18 ENCOUNTER — Other Ambulatory Visit: Payer: BLUE CROSS/BLUE SHIELD | Admitting: Internal Medicine

## 2016-04-18 DIAGNOSIS — Z79899 Other long term (current) drug therapy: Secondary | ICD-10-CM

## 2016-04-18 DIAGNOSIS — E785 Hyperlipidemia, unspecified: Secondary | ICD-10-CM

## 2016-04-18 LAB — COMPLETE METABOLIC PANEL WITH GFR
ALT: 10 U/L (ref 9–46)
AST: 16 U/L (ref 10–35)
Albumin: 3.6 g/dL (ref 3.6–5.1)
Alkaline Phosphatase: 77 U/L (ref 40–115)
BUN: 13 mg/dL (ref 7–25)
CHLORIDE: 107 mmol/L (ref 98–110)
CO2: 25 mmol/L (ref 20–31)
Calcium: 8.5 mg/dL — ABNORMAL LOW (ref 8.6–10.3)
Creat: 0.98 mg/dL (ref 0.70–1.25)
GFR, Est African American: >89 mL/min (ref >=60)
GFR, Est Non African American: 83 mL/min (ref >=60)
GLUCOSE: 90 mg/dL (ref 65–99)
POTASSIUM: 4.1 mmol/L (ref 3.5–5.3)
SODIUM: 140 mmol/L (ref 135–146)
Total Bilirubin: 0.4 mg/dL (ref 0.2–1.2)
Total Protein: 6 g/dL — ABNORMAL LOW (ref 6.1–8.1)

## 2016-04-18 LAB — LIPID PANEL
CHOLESTEROL: 206 mg/dL — AB (ref 125–200)
HDL: 49 mg/dL (ref >=40)
LDL CALC: 137 mg/dL — AB (ref <130)
TRIGLYCERIDES: 101 mg/dL (ref <150)
Total CHOL/HDL Ratio: 4.2 Ratio (ref <=5.0)
VLDL: 20 mg/dL (ref <30)

## 2016-04-19 ENCOUNTER — Telehealth: Payer: Self-pay | Admitting: Internal Medicine

## 2016-04-19 ENCOUNTER — Encounter: Payer: Self-pay | Admitting: Internal Medicine

## 2016-04-19 MED ORDER — ROSUVASTATIN CALCIUM 10 MG PO TABS: 10.0000 mg | ORAL_TABLET | Freq: Every day | ORAL | Status: DC

## 2016-04-19 NOTE — Telephone Encounter (Signed)
Patient says he is taking Crestor 5 mg daily. We are going to increase to 10 mg daily and follow-up in 3 months with out office visit but with lipid panel and liver functions.

## 2016-04-19 NOTE — Telephone Encounter (Signed)
Patient was seen in March and started on Crestor 5 mg daily. If taking, needs to increase to 10 mg daily. We do not see much change. LDL cholesterol has only decreased from 148-137. Total cholesterol has decreased from 214-206.  Left message for patient to call us and inquire if he's taking Crestor 5 mg daily.

## 2016-04-26 ENCOUNTER — Other Ambulatory Visit: Payer: Self-pay | Admitting: Internal Medicine

## 2016-04-26 NOTE — Telephone Encounter (Signed)
We recently changed him to Crestor 10 mg daily with followup advised. See recent phone call. Please call pharmacy and make change

## 2016-07-16 ENCOUNTER — Other Ambulatory Visit: Payer: Self-pay | Admitting: Internal Medicine

## 2016-08-01 ENCOUNTER — Ambulatory Visit (INDEPENDENT_AMBULATORY_CARE_PROVIDER_SITE_OTHER): Payer: BLUE CROSS/BLUE SHIELD | Admitting: Internal Medicine

## 2016-08-01 ENCOUNTER — Encounter: Payer: Self-pay | Admitting: Internal Medicine

## 2016-08-01 ENCOUNTER — Other Ambulatory Visit: Payer: BLUE CROSS/BLUE SHIELD | Admitting: Internal Medicine

## 2016-08-01 DIAGNOSIS — M5431 Sciatica, right side: Secondary | ICD-10-CM | POA: Diagnosis not present

## 2016-08-01 DIAGNOSIS — E785 Hyperlipidemia, unspecified: Secondary | ICD-10-CM

## 2016-08-01 LAB — HEPATIC FUNCTION PANEL
ALBUMIN: 3.8 g/dL (ref 3.6–5.1)
ALK PHOS: 72 U/L (ref 40–115)
ALT: 12 U/L (ref 9–46)
AST: 19 U/L (ref 10–35)
BILIRUBIN TOTAL: 0.4 mg/dL (ref 0.2–1.2)
Bilirubin, Direct: 0.1 mg/dL (ref <=0.2)
Indirect Bilirubin: 0.3 mg/dL (ref 0.2–1.2)
Total Protein: 6 g/dL — ABNORMAL LOW (ref 6.1–8.1)

## 2016-08-01 LAB — LIPID PANEL
Cholesterol: 228 mg/dL — ABNORMAL HIGH (ref 125–200)
HDL: 45 mg/dL (ref >=40)
LDL Cholesterol: 161 mg/dL — ABNORMAL HIGH (ref <130)
TRIGLYCERIDES: 112 mg/dL (ref <150)
Total CHOL/HDL Ratio: 5.1 Ratio — ABNORMAL HIGH (ref <=5.0)
VLDL: 22 mg/dL (ref <30)

## 2016-08-01 MED ORDER — CYCLOBENZAPRINE HCL 10 MG PO TABS: 10.0000 mg | ORAL_TABLET | Freq: Every day | ORAL | 0 refills | Status: DC

## 2016-08-01 MED ORDER — HYDROCODONE-ACETAMINOPHEN 5-325 MG PO TABS: 1.0000 | ORAL_TABLET | Freq: Four times a day (QID) | ORAL | 0 refills | Status: DC | PRN

## 2016-08-01 MED ORDER — PREDNISONE 10 MG PO TABS: ORAL_TABLET | ORAL | 1 refills | Status: DC

## 2016-08-01 NOTE — Progress Notes (Signed)
   Subjective:    Patient ID: Robert Mora, male    DOB: [birth year], 60 y.o.   MRN: [id number]  HPI  Onset Tuesday after working out at gym of bilateral low back pain worse on the right than the left. History of right sciatica. Has not done any heavy lifting. No relief with Tylenol. He took some leftover hydrocodone/APAP and got some relief. Generally goes to the gym several times a week and has a Physiological scientist. Says it helps relieve stress. Doesn't recall any injury at the gym.    Review of Systems see above     Objective:   Physical Exam He is uncomfortable sitting still in the chair due to pain. Straight leg raising is positive on the right and negative on the left. Muscle strength is normal in the lower extremities. Deep tendon reflexes 2+ and symmetrical in the knees       Assessment & Plan:  Right sciatica  Plan: Sterapred DS 10 mg 6 day dosepak. Take as directed with 1 refill. If not better in 6 days have prescription refilled. Norco 5/325 one by mouth every 6 hours when necessary pain #60 no refill. Flexeril 10 mg 1/2-1 tablet at bedtime. Call if not better in 2 weeks or sooner if worse.  Lab work drawn today and pending

## 2016-08-01 NOTE — Patient Instructions (Signed)
Sterapred DS 10 mg dosepak with 1 refill. Flexeril 10 mg 1/2-1 tablet at bedtime. Norco 5/325 every 6 hours when necessary pain.

## 2016-08-02 ENCOUNTER — Telehealth: Payer: Self-pay | Admitting: *Deleted

## 2016-08-02 NOTE — Telephone Encounter (Signed)
Schd. Pt for Labs only on 10/25/16, and OV on 10/28/16

## 2016-08-02 NOTE — Telephone Encounter (Signed)
He will come for lipid panel and then OV in 3 months. Check with Sharyn Lull for times.

## 2016-08-02 NOTE — Telephone Encounter (Signed)
Called Pt to review labs as requested, Pt states he is taking the Crestor 10mg  daily, and he is trying to eat a low fat diet. States he wants to hold off on upping the Crestor to 20mg  due to the medicine making him feel sluggish, states he wants to stay on the 10mg  and work harder on his diet, the previous note stated for him to come back in 35mo, do you want to bring him in sooner since he did not up the dose? Also, will visit be for labs only, or to see you as well?

## 2016-08-27 ENCOUNTER — Other Ambulatory Visit: Payer: Self-pay | Admitting: Internal Medicine

## 2016-09-18 ENCOUNTER — Other Ambulatory Visit: Payer: Self-pay | Admitting: Internal Medicine

## 2016-09-23 ENCOUNTER — Other Ambulatory Visit: Payer: Self-pay | Admitting: Internal Medicine

## 2016-10-12 ENCOUNTER — Other Ambulatory Visit: Payer: Self-pay | Admitting: Internal Medicine

## 2016-10-19 ENCOUNTER — Other Ambulatory Visit: Payer: Self-pay | Admitting: Internal Medicine

## 2016-10-25 ENCOUNTER — Other Ambulatory Visit: Payer: BLUE CROSS/BLUE SHIELD | Admitting: Internal Medicine

## 2016-10-25 DIAGNOSIS — E785 Hyperlipidemia, unspecified: Secondary | ICD-10-CM

## 2016-10-25 LAB — LIPID PANEL
CHOL/HDL RATIO: 4.6 ratio (ref <5.0)
CHOLESTEROL: 228 mg/dL — AB (ref <200)
HDL: 50 mg/dL (ref >40)
LDL Cholesterol: 160 mg/dL — ABNORMAL HIGH (ref <100)
Triglycerides: 90 mg/dL (ref <150)
VLDL: 18 mg/dL (ref <30)

## 2016-10-28 ENCOUNTER — Encounter: Payer: Self-pay | Admitting: Internal Medicine

## 2016-10-28 ENCOUNTER — Ambulatory Visit (INDEPENDENT_AMBULATORY_CARE_PROVIDER_SITE_OTHER): Payer: BLUE CROSS/BLUE SHIELD | Admitting: Internal Medicine

## 2016-10-28 VITALS — BP 110/70 | HR 69 | Temp 97.9°F | Ht 68.0 in | Wt 158.0 lb

## 2016-10-28 DIAGNOSIS — E7800 Pure hypercholesterolemia, unspecified: Secondary | ICD-10-CM

## 2016-10-28 DIAGNOSIS — Z23 Encounter for immunization: Secondary | ICD-10-CM | POA: Diagnosis not present

## 2016-10-28 MED ORDER — ROSUVASTATIN CALCIUM 20 MG PO TABS: 20.0000 mg | ORAL_TABLET | Freq: Every day | ORAL | 3 refills | Status: DC

## 2016-11-10 NOTE — Progress Notes (Signed)
   Subjective:    Patient ID: Robert Mora, male    DOB: Jan 21, 1956, 60 y.o.   MRN: [id number]  HPI In today for follow-up of hyperlipidemia. He is on Crestor 10 mg daily. He had recent lipid panel showing no significant improvement.  Total cholesterol in September was 228 and is still 228. LDL cholesterol was 161 in September and is now 160. Triglycerides are normal. HDL cholesterol is 50.  In June 2017 his LDL was 137 with a total cholesterol of 206.  He is agreeable to increasing Crestor to 20 mg daily.    Review of Systems no new complaints     Objective:   Physical Exam  Not examined. Stat 15 minutes speaking with him about lipid management, diet and exercise.      Assessment & Plan:  Hyperlipidemia  Plan: Increase Crestor to 20 mg daily. He will follow-up February 19 with office visit after having fasting lipid panel liver functions.

## 2016-11-10 NOTE — Patient Instructions (Signed)
Increase Crestor to 20 mg daily and return in February for follow-up

## 2016-12-27 ENCOUNTER — Other Ambulatory Visit: Payer: BLUE CROSS/BLUE SHIELD | Admitting: Internal Medicine

## 2016-12-27 DIAGNOSIS — E785 Hyperlipidemia, unspecified: Secondary | ICD-10-CM

## 2016-12-27 DIAGNOSIS — Z79899 Other long term (current) drug therapy: Secondary | ICD-10-CM

## 2016-12-27 LAB — HEPATIC FUNCTION PANEL
ALBUMIN: 3.7 g/dL (ref 3.6–5.1)
ALK PHOS: 71 U/L (ref 40–115)
ALT: 20 U/L (ref 9–46)
AST: 24 U/L (ref 10–35)
BILIRUBIN DIRECT: 0.1 mg/dL (ref <=0.2)
BILIRUBIN INDIRECT: 0.3 mg/dL (ref 0.2–1.2)
BILIRUBIN TOTAL: 0.4 mg/dL (ref 0.2–1.2)
Total Protein: 6.3 g/dL (ref 6.1–8.1)

## 2016-12-27 LAB — LIPID PANEL
Cholesterol: 163 mg/dL (ref <200)
HDL: 46 mg/dL (ref >40)
LDL CALC: 93 mg/dL (ref <100)
Total CHOL/HDL Ratio: 3.5 Ratio (ref <5.0)
Triglycerides: 118 mg/dL (ref <150)
VLDL: 24 mg/dL (ref <30)

## 2016-12-30 ENCOUNTER — Ambulatory Visit (INDEPENDENT_AMBULATORY_CARE_PROVIDER_SITE_OTHER): Payer: BLUE CROSS/BLUE SHIELD | Admitting: Internal Medicine

## 2016-12-30 ENCOUNTER — Encounter: Payer: Self-pay | Admitting: Internal Medicine

## 2016-12-30 VITALS — BP 110/70 | HR 62 | Temp 98.3°F | Ht 68.0 in | Wt 161.0 lb

## 2016-12-30 DIAGNOSIS — E7800 Pure hypercholesterolemia, unspecified: Secondary | ICD-10-CM | POA: Diagnosis not present

## 2016-12-30 MED ORDER — SILDENAFIL CITRATE 100 MG PO TABS: 50.0000 mg | ORAL_TABLET | Freq: Every day | ORAL | 5 refills | Status: DC | PRN

## 2016-12-30 NOTE — Progress Notes (Signed)
   Subjective:    Patient ID: Robert Mora, male    DOB: [birth year], 61 y.o.   MRN: [id number]  HPI   61 year old White male with History of hyperlipidemia. At last visit we increased his Crestor to 20 mg daily but he developed myalgias. He subsequently quit taking the Crestor. He started taking coenzyme Q and went back to taking 10 mg of Crestor daily. He's changed his diet a bit has been working out some. His lipid panel is now normal. 2 months ago his total cholesterol was 228 with an LDL cholesterol of 160. Now total cholesterol is 163 with an LDL cholesterol of 93. Liver panel is normal.    Review of Systems     Objective:   Physical Exam Not examined       Assessment & Plan:  Hyperlipidemia markedly improved with coenzyme Q and Crestor 10 mg daily along with exercise and diet.  Plan: Return in August for physical examination or as needed.

## 2017-01-05 NOTE — Patient Instructions (Signed)
Lipid panel is now normal on Crestor 10 mg daily along with diet exercise and coenzyme Q. Return in August.

## 2017-01-09 ENCOUNTER — Other Ambulatory Visit: Payer: Self-pay | Admitting: Internal Medicine

## 2017-03-03 ENCOUNTER — Ambulatory Visit (INDEPENDENT_AMBULATORY_CARE_PROVIDER_SITE_OTHER): Payer: BLUE CROSS/BLUE SHIELD | Admitting: Internal Medicine

## 2017-03-03 ENCOUNTER — Other Ambulatory Visit: Payer: Self-pay | Admitting: Internal Medicine

## 2017-03-03 ENCOUNTER — Encounter: Payer: Self-pay | Admitting: Internal Medicine

## 2017-03-03 VITALS — BP 120/72 | HR 69 | Temp 97.0°F | Wt 168.0 lb

## 2017-03-03 DIAGNOSIS — Z202 Contact with and (suspected) exposure to infections with a predominantly sexual mode of transmission: Secondary | ICD-10-CM

## 2017-03-03 MED ORDER — DOXYCYCLINE HYCLATE 100 MG PO TABS: 100.0000 mg | ORAL_TABLET | Freq: Two times a day (BID) | ORAL | 0 refills | Status: DC

## 2017-03-03 NOTE — Patient Instructions (Signed)
Rectal swab sent for chlamydia. Doxycycline 100 mg twice daily for 10 days.

## 2017-03-03 NOTE — Progress Notes (Signed)
   Subjective:    Patient ID: Robert Mora, male    DOB: 1956/06/30, 61 y.o.   MRN: [id number]  HPI Patient says his live-in partner exposed him to chlamydia through sexual intercourse outside relationship recently. He just found out about this last evening. He is asymptomatic.    Review of Systems see above     Objective:   Physical Exam  Rectal swab obtained for chlamydia      Assessment & Plan:   Chlamydia exposure  Plan: Rectal swab for chlamydia seen at. Doxycycline 100 mg twice daily for 10 days.

## 2017-03-07 LAB — CT/NG RNA, TMA RECTAL

## 2017-03-11 ENCOUNTER — Encounter: Payer: Self-pay | Admitting: Internal Medicine

## 2017-03-11 ENCOUNTER — Telehealth: Payer: Self-pay | Admitting: Internal Medicine

## 2017-03-11 NOTE — Telephone Encounter (Signed)
Lab says rectal swab for chlamydia was submitted in wrong tube. The tube that I used was a viral transport media that included chlamydia according to the label. He will need to return for chlamydia test. We will do rectal swab and urine for chlamydia. He will call next week for an appointment. He is currently taking doxycycline for 10 days and should finish that later this week.  The lab is to clarify what collection tubes they want to use for these.

## 2017-03-19 ENCOUNTER — Other Ambulatory Visit: Payer: Self-pay | Admitting: Internal Medicine

## 2017-03-20 ENCOUNTER — Encounter: Payer: Self-pay | Admitting: Internal Medicine

## 2017-03-20 ENCOUNTER — Ambulatory Visit (INDEPENDENT_AMBULATORY_CARE_PROVIDER_SITE_OTHER): Payer: BLUE CROSS/BLUE SHIELD | Admitting: Internal Medicine

## 2017-03-20 ENCOUNTER — Other Ambulatory Visit: Payer: Self-pay | Admitting: Internal Medicine

## 2017-03-20 VITALS — BP 100/70 | HR 90 | Temp 98.0°F | Wt 169.0 lb

## 2017-03-20 DIAGNOSIS — Z202 Contact with and (suspected) exposure to infections with a predominantly sexual mode of transmission: Secondary | ICD-10-CM

## 2017-03-20 NOTE — Patient Instructions (Signed)
Test results are pending. Doxycycline course finished.

## 2017-03-20 NOTE — Progress Notes (Signed)
   Subjective:    Patient ID: Robert Mora, male    DOB: [birth year], 61 y.o.   MRN: [id number]  HPI His partner had a chlamydia test recently. We did a rectal swab and treated him with doxycycline for 10 days. Unfortunately, the lab indicated that we did not send the rectal swab in the right collection vesicle and therefore they would not run the test.  We think we have cleared this up with the lab and have sent both urine and rectal swab today. However this is more like a test of cure because we cannot confirm that he really had chlamydia since the lab could not do the test although he definitely had chlamydia exposure.    Review of Systems see above     Objective:   Physical Exam  Not examined. Rectal swab obtained as well as urine specimen      Assessment & Plan:  Chlamydia exposure  Plan: He has finished course of doxycycline and we will await the results of this testing

## 2017-03-21 LAB — CHLAMYDIA TRACHOMATIS, PROBE AMP: CT PROBE, AMP APTIMA: NOT DETECTED

## 2017-03-23 LAB — CT/NG RNA, TMA RECTAL
CHLAMYDIA TRACHOMATIS RNA: NOT DETECTED
NEISSERIA GONORRHOEAE RNA: NOT DETECTED

## 2017-04-09 ENCOUNTER — Other Ambulatory Visit: Payer: Self-pay | Admitting: Internal Medicine

## 2017-04-09 DIAGNOSIS — Z202 Contact with and (suspected) exposure to infections with a predominantly sexual mode of transmission: Secondary | ICD-10-CM

## 2017-04-09 MED ORDER — AZITHROMYCIN 500 MG PO TABS: 1000.0000 mg | ORAL_TABLET | Freq: Once | ORAL | 0 refills | Status: AC

## 2017-04-09 MED ORDER — AZITHROMYCIN 1 G PO PACK: 1.0000 g | PACK | Freq: Once | ORAL | 0 refills | Status: DC

## 2017-05-09 ENCOUNTER — Encounter: Payer: Self-pay | Admitting: Internal Medicine

## 2017-05-09 ENCOUNTER — Ambulatory Visit (INDEPENDENT_AMBULATORY_CARE_PROVIDER_SITE_OTHER): Payer: BLUE CROSS/BLUE SHIELD | Admitting: Internal Medicine

## 2017-05-09 VITALS — BP 100/64 | HR 70 | Temp 97.9°F | Wt 155.0 lb

## 2017-05-09 DIAGNOSIS — K529 Noninfective gastroenteritis and colitis, unspecified: Secondary | ICD-10-CM

## 2017-05-09 NOTE — Progress Notes (Signed)
   Subjective:    Patient ID: Robert Mora, male    DOB: [birth year], 61 y.o.   MRN: [id number]  HPI  Patient has had diarrhea since early part of week. Up to 8 stools per day. He went on clear liquids after calling the office for advice. Diarrhea then decreased. He does have a history of Giardia in the past. He is concerned about infectious diarrhea. No fever or shaking chills. No vomiting. Norovirus has been going around recently. No recent antibiotics.    Review of Systems     Objective:   Physical Exam  Abdomen is soft nondistended without hepatosplenomegaly masses or significant tenderness      Assessment & Plan:  Gastroenteritis  Plan: Patient will take collection containers for stool specimen for collection. Stay with clear liquids and advance diet to soft if possible. Explained viral gastroenteritis may take 7-10 days to resolve. Stay well hydrated. Stay out of heat.

## 2017-05-09 NOTE — Patient Instructions (Signed)
Stay with clear liquids until diarrhea has resolved for 24 hours then advance diet slowly. Specimen made be submitted for stool studies.

## 2017-06-23 ENCOUNTER — Other Ambulatory Visit: Payer: BLUE CROSS/BLUE SHIELD | Admitting: Internal Medicine

## 2017-06-27 ENCOUNTER — Encounter: Payer: BLUE CROSS/BLUE SHIELD | Admitting: Internal Medicine

## 2017-08-05 ENCOUNTER — Ambulatory Visit (INDEPENDENT_AMBULATORY_CARE_PROVIDER_SITE_OTHER): Payer: BLUE CROSS/BLUE SHIELD | Admitting: Internal Medicine

## 2017-08-05 ENCOUNTER — Encounter: Payer: Self-pay | Admitting: Internal Medicine

## 2017-08-05 VITALS — BP 120/62 | HR 69 | Temp 98.0°F | Wt 158.0 lb

## 2017-08-05 DIAGNOSIS — J069 Acute upper respiratory infection, unspecified: Secondary | ICD-10-CM | POA: Diagnosis not present

## 2017-08-05 MED ORDER — AZITHROMYCIN 250 MG PO TABS: ORAL_TABLET | ORAL | 0 refills | Status: DC

## 2017-08-05 NOTE — Patient Instructions (Addendum)
Zithromax Z pak take 2 tablets day one followed by 1 tablet days 2 through 5. Rest and drink plenty of fluids. May take over-the-counter cold medications.

## 2017-08-05 NOTE — Progress Notes (Signed)
   Subjective:    Patient ID: Robert Mora, male    DOB: 07-01-1956, 61 y.o.   MRN: [id number]  HPI  61 year old male in today with a one-week history of URI symptoms. Has had malaise cough and fatigue. Some discolored sputum production. No fever or shaking chills. Planning a vacation to the beach next week. Wants to feel better.  Previously seen for diarrhea in late June. Apparently had diarrhea for about 8 weeks but it spontaneously resolved without treatment. No stool studies were done.    Review of Systems see above.     Objective:   Physical Exam Skin warm and dry. Nodes none. TMs slightly full. Pharynx is clear. Neck is supple. Chest clear to auscultation without rales or wheezing.       Assessment & Plan:  Acute URI  Plan: Zithromax Z-PAK take 2 tablets day 1 followed by 1 tablet days 2 through 5. May take over-the-counter cough medication and cold medication if needed. Call if not better in 10 days to 2 weeks or sooner if worse.

## 2017-08-18 ENCOUNTER — Other Ambulatory Visit: Payer: BLUE CROSS/BLUE SHIELD | Admitting: Internal Medicine

## 2017-08-18 DIAGNOSIS — F39 Unspecified mood [affective] disorder: Secondary | ICD-10-CM

## 2017-08-18 DIAGNOSIS — E785 Hyperlipidemia, unspecified: Secondary | ICD-10-CM

## 2017-08-18 DIAGNOSIS — Z125 Encounter for screening for malignant neoplasm of prostate: Secondary | ICD-10-CM

## 2017-08-18 DIAGNOSIS — Z0000 Encounter for general adult medical examination without abnormal findings: Secondary | ICD-10-CM

## 2017-08-18 LAB — CBC WITH DIFFERENTIAL/PLATELET
BASOS ABS: 23 {cells}/uL (ref 0–200)
Basophils Relative: 0.3 %
EOS ABS: 273 {cells}/uL (ref 15–500)
Eosinophils Relative: 3.5 %
HCT: 37.1 % — ABNORMAL LOW (ref 38.5–50.0)
Hemoglobin: 12.6 g/dL — ABNORMAL LOW (ref 13.2–17.1)
Lymphs Abs: 1381 cells/uL (ref 850–3900)
MCH: 31.8 pg (ref 27.0–33.0)
MCHC: 34 g/dL (ref 32.0–36.0)
MCV: 93.7 fL (ref 80.0–100.0)
MPV: 11.4 fL (ref 7.5–12.5)
Monocytes Relative: 11.7 %
NEUTROS PCT: 66.8 %
Neutro Abs: 5210 cells/uL (ref 1500–7800)
PLATELETS: 157 10*3/uL (ref 140–400)
RBC: 3.96 10*6/uL — ABNORMAL LOW (ref 4.20–5.80)
RDW: 12 % (ref 11.0–15.0)
TOTAL LYMPHOCYTE: 17.7 %
WBC: 7.8 10*3/uL (ref 3.8–10.8)
WBCMIX: 913 {cells}/uL (ref 200–950)

## 2017-08-18 LAB — COMPLETE METABOLIC PANEL WITH GFR
AG RATIO: 1.7 (calc) (ref 1.0–2.5)
ALT: 13 U/L (ref 9–46)
AST: 17 U/L (ref 10–35)
Albumin: 3.6 g/dL (ref 3.6–5.1)
Alkaline phosphatase (APISO): 81 U/L (ref 40–115)
BILIRUBIN TOTAL: 0.4 mg/dL (ref 0.2–1.2)
BUN: 13 mg/dL (ref 7–25)
CHLORIDE: 108 mmol/L (ref 98–110)
CO2: 28 mmol/L (ref 20–32)
Calcium: 8.8 mg/dL (ref 8.6–10.3)
Creat: 0.84 mg/dL (ref 0.70–1.25)
GFR, Est African American: 108 mL/min/{1.73_m2} (ref >=60)
GFR, Est Non African American: 93 mL/min/{1.73_m2} (ref >=60)
Globulin: 2.1 g/dL (calc) (ref 1.9–3.7)
Glucose, Bld: 106 mg/dL — ABNORMAL HIGH (ref 65–99)
POTASSIUM: 4.2 mmol/L (ref 3.5–5.3)
SODIUM: 142 mmol/L (ref 135–146)
Total Protein: 5.7 g/dL — ABNORMAL LOW (ref 6.1–8.1)

## 2017-08-18 LAB — LIPID PANEL
Cholesterol: 148 mg/dL (ref <200)
HDL: 40 mg/dL — AB (ref >40)
LDL Cholesterol (Calc): 87 mg/dL (calc)
Non-HDL Cholesterol (Calc): 108 mg/dL (calc) (ref <130)
TRIGLYCERIDES: 117 mg/dL (ref <150)
Total CHOL/HDL Ratio: 3.7 (calc) (ref <5.0)

## 2017-08-18 LAB — PSA: PSA: 0.4 ng/mL (ref <=4.0)

## 2017-08-19 ENCOUNTER — Ambulatory Visit (INDEPENDENT_AMBULATORY_CARE_PROVIDER_SITE_OTHER): Payer: BLUE CROSS/BLUE SHIELD | Admitting: Internal Medicine

## 2017-08-19 ENCOUNTER — Encounter: Payer: Self-pay | Admitting: Internal Medicine

## 2017-08-19 VITALS — BP 122/74 | Temp 97.8°F | Ht 67.5 in | Wt 158.0 lb

## 2017-08-19 DIAGNOSIS — F39 Unspecified mood [affective] disorder: Secondary | ICD-10-CM

## 2017-08-19 DIAGNOSIS — Z0000 Encounter for general adult medical examination without abnormal findings: Secondary | ICD-10-CM

## 2017-08-19 DIAGNOSIS — Z23 Encounter for immunization: Secondary | ICD-10-CM

## 2017-08-19 DIAGNOSIS — Z86010 Personal history of colonic polyps: Secondary | ICD-10-CM

## 2017-08-19 DIAGNOSIS — J069 Acute upper respiratory infection, unspecified: Secondary | ICD-10-CM

## 2017-08-19 DIAGNOSIS — E7800 Pure hypercholesterolemia, unspecified: Secondary | ICD-10-CM | POA: Diagnosis not present

## 2017-08-19 DIAGNOSIS — N529 Male erectile dysfunction, unspecified: Secondary | ICD-10-CM

## 2017-08-19 DIAGNOSIS — Z8619 Personal history of other infectious and parasitic diseases: Secondary | ICD-10-CM

## 2017-08-19 LAB — POCT URINALYSIS DIPSTICK
Bilirubin, UA: NEGATIVE
GLUCOSE UA: NEGATIVE
Ketones, UA: NEGATIVE
Leukocytes, UA: NEGATIVE
Nitrite, UA: NEGATIVE
Protein, UA: NEGATIVE
RBC UA: NEGATIVE
SPEC GRAV UA: 1.02 (ref 1.010–1.025)
UROBILINOGEN UA: 0.2 U/dL
pH, UA: 6.5 (ref 5.0–8.0)

## 2017-08-19 MED ORDER — TADALAFIL 20 MG PO TABS: 10.0000 mg | ORAL_TABLET | ORAL | 11 refills | Status: DC | PRN
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
## 2017-08-20 ENCOUNTER — Other Ambulatory Visit: Payer: Self-pay | Admitting: Internal Medicine

## 2017-08-21 NOTE — Telephone Encounter (Signed)
This was to have been refilled at most recent visit. Please check

## 2017-08-22 ENCOUNTER — Encounter: Payer: BLUE CROSS/BLUE SHIELD | Admitting: Internal Medicine

## 2017-09-10 NOTE — Patient Instructions (Addendum)
It was a pleasure to see you today.  Flu vaccine given.  Take Zithromax Z-Pak for respiratory infection symptoms.  Continue same medications and return in 6-12 months.

## 2017-09-22 ENCOUNTER — Telehealth: Payer: Self-pay

## 2017-09-22 MED ORDER — TADALAFIL 20 MG PO TABS: 10.0000 mg | ORAL_TABLET | ORAL | 11 refills | Status: DC | PRN

## 2017-09-22 NOTE — Telephone Encounter (Signed)
Pa was done for Cialis and insurance stated they would only pay for 4 tablets resent as the requested amount

## 2017-11-05 ENCOUNTER — Other Ambulatory Visit: Payer: Self-pay | Admitting: Internal Medicine

## 2017-11-13 ENCOUNTER — Other Ambulatory Visit: Payer: Self-pay | Admitting: Internal Medicine

## 2018-02-04 ENCOUNTER — Other Ambulatory Visit: Payer: Self-pay | Admitting: Internal Medicine

## 2018-02-04 DIAGNOSIS — E785 Hyperlipidemia, unspecified: Secondary | ICD-10-CM

## 2018-02-13 ENCOUNTER — Other Ambulatory Visit: Payer: BLUE CROSS/BLUE SHIELD | Admitting: Internal Medicine

## 2018-02-13 DIAGNOSIS — E785 Hyperlipidemia, unspecified: Secondary | ICD-10-CM

## 2018-02-13 LAB — LIPID PANEL
CHOLESTEROL: 157 mg/dL (ref <200)
HDL: 48 mg/dL (ref >40)
LDL CHOLESTEROL (CALC): 92 mg/dL
Non-HDL Cholesterol (Calc): 109 mg/dL (calc) (ref <130)
Total CHOL/HDL Ratio: 3.3 (calc) (ref <5.0)
Triglycerides: 81 mg/dL (ref <150)

## 2018-02-13 LAB — HEPATIC FUNCTION PANEL
AG RATIO: 1.8 (calc) (ref 1.0–2.5)
ALKALINE PHOSPHATASE (APISO): 78 U/L (ref 40–115)
ALT: 14 U/L (ref 9–46)
AST: 19 U/L (ref 10–35)
Albumin: 3.8 g/dL (ref 3.6–5.1)
BILIRUBIN TOTAL: 0.3 mg/dL (ref 0.2–1.2)
Bilirubin, Direct: 0.1 mg/dL (ref 0.0–0.2)
GLOBULIN: 2.1 g/dL (ref 1.9–3.7)
Indirect Bilirubin: 0.2 mg/dL (calc) (ref 0.2–1.2)
TOTAL PROTEIN: 5.9 g/dL — AB (ref 6.1–8.1)

## 2018-02-17 ENCOUNTER — Encounter: Payer: Self-pay | Admitting: Internal Medicine

## 2018-02-17 ENCOUNTER — Ambulatory Visit (INDEPENDENT_AMBULATORY_CARE_PROVIDER_SITE_OTHER): Payer: BLUE CROSS/BLUE SHIELD | Admitting: Internal Medicine

## 2018-02-17 VITALS — BP 110/62 | HR 72 | Ht 67.5 in | Wt 160.0 lb

## 2018-02-17 DIAGNOSIS — E7800 Pure hypercholesterolemia, unspecified: Secondary | ICD-10-CM | POA: Diagnosis not present

## 2018-02-17 DIAGNOSIS — H6503 Acute serous otitis media, bilateral: Secondary | ICD-10-CM | POA: Diagnosis not present

## 2018-02-17 DIAGNOSIS — F39 Unspecified mood [affective] disorder: Secondary | ICD-10-CM

## 2018-02-17 MED ORDER — METHYLPREDNISOLONE ACETATE 80 MG/ML IJ SUSP
80.0000 mg | Freq: Once | INTRAMUSCULAR | Status: AC
  Administered 2018-02-17: 80 mg via INTRAMUSCULAR

## 2018-02-17 MED ORDER — AZITHROMYCIN 250 MG PO TABS: ORAL_TABLET | ORAL | 0 refills | Status: DC

## 2018-02-17 NOTE — Progress Notes (Signed)
   Subjective:    Patient ID: Robert Mora, male    DOB: 1956/04/03, 62 y.o.   MRN: [id number]  HPI 62 year old Male in today for 34-month recheck.  History of hyperlipidemia treated with Crestor 10 mg daily. Lipid panel and liver functions are within normal limits.  He has come down with a respiratory infection.  He is ears feel stopped up and he has some respiratory congestion.   Review of Systems no new issues- history of mood disorder treated with Wellbutrin which is stable     Objective:   Physical Exam 110/62  skin is warm and dry both TMs are full bilaterally but not red.  Pharynx is clear.  Neck is supple without adenopathy.  Chest clear to auscultation without rales or wheezing.  Cardiac exam regular rate and rhythm normal S1 and S2.  Extremities without edema.      Assessment & Plan:  Bilateral serous otitis media  Acute URI  Hyperlipidemia  Plan: Zithromax Z-Pak take 2 tablets day 1 followed by 1 tablet days 2 through 5.  Rest and drink plenty of fluids.  Continue same dose of lipid-lowering medication and follow-up with physical exam in 6 months.

## 2018-03-01 ENCOUNTER — Other Ambulatory Visit: Payer: Self-pay | Admitting: Internal Medicine

## 2018-03-10 NOTE — Patient Instructions (Signed)
Take Zithromax Z-PAK as directed.  Continue Crestor 10 mg daily.  Rest and drink plenty of fluids.  Follow-up with physical exam in 6 months.

## 2018-04-01 ENCOUNTER — Telehealth: Payer: Self-pay | Admitting: Internal Medicine

## 2018-04-01 NOTE — Telephone Encounter (Signed)
He may come tomorrow for brief  OV and Depomedrol injection. I will need to see him

## 2018-04-01 NOTE — Telephone Encounter (Signed)
Robert Mora Self [phone]  Robert Reaper called to see if he could possible come in and get an injection like he got a few months ago. He stated that it really helped, but now his head is stopped up again, draining in his throat, eyes burning, weakness and tiredness in legs.

## 2018-04-01 NOTE — Telephone Encounter (Signed)
Called patient scheduled an appointment for tomorrow

## 2018-04-02 ENCOUNTER — Encounter: Payer: Self-pay | Admitting: Internal Medicine

## 2018-04-02 ENCOUNTER — Ambulatory Visit: Payer: BLUE CROSS/BLUE SHIELD | Admitting: Internal Medicine

## 2018-04-02 VITALS — BP 110/80 | HR 78 | Temp 98.2°F | Ht 68.0 in | Wt 162.0 lb

## 2018-04-02 DIAGNOSIS — R0981 Nasal congestion: Secondary | ICD-10-CM

## 2018-04-02 DIAGNOSIS — J069 Acute upper respiratory infection, unspecified: Secondary | ICD-10-CM | POA: Diagnosis not present

## 2018-04-02 DIAGNOSIS — J3089 Other allergic rhinitis: Secondary | ICD-10-CM | POA: Diagnosis not present

## 2018-04-02 MED ORDER — AZITHROMYCIN 250 MG PO TABS: ORAL_TABLET | ORAL | 0 refills | Status: DC

## 2018-04-02 MED ORDER — METHYLPREDNISOLONE ACETATE 80 MG/ML IJ SUSP
80.0000 mg | Freq: Once | INTRAMUSCULAR | Status: AC
  Administered 2018-04-02: 80 mg via INTRAMUSCULAR

## 2018-04-02 NOTE — Patient Instructions (Signed)
Depo-Medrol 80 mg IM.  Zithromax Z-Pak take 2 tablets day 1 followed by 1 tablet days 2 through 5

## 2018-04-02 NOTE — Progress Notes (Signed)
   Subjective:    Patient ID: Robert Mora, male    DOB: 10-02-1956, 62 y.o.   MRN: [id number]  HPI Several days of postnasal drip and nasal congestion.  No fever or chills.  Sometimes coughs updiscolored sputum.  At last visit early April was given Zithromax Z-Pak and Depo-Medrol IM which worked well for him.  Partner has similar issues and was treated yesterday with Depo-Medrol.  The patient not sure if this is a respiratory infection or  allergic rhinitis related.    Review of Systems see above     Objective:   Physical Exam Right TM is full.  Left TM clear.  Pharynx slightly injected.  He sounds a bit nasally congested and hoarse.  Chest is clear to auscultation without rales or wheezing.       Assessment & Plan:  Acute URI  Plan: Depo-Medrol 80 mg IM.  Zithromax Z-Pak take 2 tablets day 1 followed by 1 tablet days 2-5

## 2018-08-19 ENCOUNTER — Other Ambulatory Visit: Payer: Self-pay | Admitting: Internal Medicine

## 2018-08-19 DIAGNOSIS — Z0000 Encounter for general adult medical examination without abnormal findings: Secondary | ICD-10-CM

## 2018-08-19 DIAGNOSIS — E7800 Pure hypercholesterolemia, unspecified: Secondary | ICD-10-CM

## 2018-08-19 DIAGNOSIS — F39 Unspecified mood [affective] disorder: Secondary | ICD-10-CM

## 2018-08-19 DIAGNOSIS — N529 Male erectile dysfunction, unspecified: Secondary | ICD-10-CM

## 2018-08-21 ENCOUNTER — Other Ambulatory Visit: Payer: BLUE CROSS/BLUE SHIELD | Admitting: Internal Medicine

## 2018-08-21 DIAGNOSIS — N529 Male erectile dysfunction, unspecified: Secondary | ICD-10-CM

## 2018-08-21 DIAGNOSIS — Z0000 Encounter for general adult medical examination without abnormal findings: Secondary | ICD-10-CM

## 2018-08-21 DIAGNOSIS — F39 Unspecified mood [affective] disorder: Secondary | ICD-10-CM

## 2018-08-21 DIAGNOSIS — E7800 Pure hypercholesterolemia, unspecified: Secondary | ICD-10-CM

## 2018-08-21 LAB — CBC WITH DIFFERENTIAL/PLATELET
BASOS ABS: 24 {cells}/uL (ref 0–200)
Basophils Relative: 0.3 %
EOS ABS: 200 {cells}/uL (ref 15–500)
Eosinophils Relative: 2.5 %
HEMATOCRIT: 37.6 % — AB (ref 38.5–50.0)
HEMOGLOBIN: 13 g/dL — AB (ref 13.2–17.1)
LYMPHS ABS: 1976 {cells}/uL (ref 850–3900)
MCH: 32.9 pg (ref 27.0–33.0)
MCHC: 34.6 g/dL (ref 32.0–36.0)
MCV: 95.2 fL (ref 80.0–100.0)
MPV: 10.9 fL (ref 7.5–12.5)
Monocytes Relative: 8 %
NEUTROS ABS: 5160 {cells}/uL (ref 1500–7800)
NEUTROS PCT: 64.5 %
Platelets: 179 10*3/uL (ref 140–400)
RBC: 3.95 10*6/uL — ABNORMAL LOW (ref 4.20–5.80)
RDW: 11.9 % (ref 11.0–15.0)
Total Lymphocyte: 24.7 %
WBC mixed population: 640 cells/uL (ref 200–950)
WBC: 8 10*3/uL (ref 3.8–10.8)

## 2018-08-21 LAB — LIPID PANEL
Cholesterol: 180 mg/dL (ref <200)
HDL: 40 mg/dL — AB (ref >40)
LDL Cholesterol (Calc): 118 mg/dL (calc) — ABNORMAL HIGH
NON-HDL CHOLESTEROL (CALC): 140 mg/dL — AB (ref <130)
Total CHOL/HDL Ratio: 4.5 (calc) (ref <5.0)
Triglycerides: 117 mg/dL (ref <150)

## 2018-08-21 LAB — COMPLETE METABOLIC PANEL WITH GFR
AG Ratio: 1.8 (calc) (ref 1.0–2.5)
ALBUMIN MSPROF: 3.7 g/dL (ref 3.6–5.1)
ALT: 13 U/L (ref 9–46)
AST: 20 U/L (ref 10–35)
Alkaline phosphatase (APISO): 76 U/L (ref 40–115)
BUN: 14 mg/dL (ref 7–25)
CALCIUM: 8.9 mg/dL (ref 8.6–10.3)
CO2: 28 mmol/L (ref 20–32)
Chloride: 108 mmol/L (ref 98–110)
Creat: 0.98 mg/dL (ref 0.70–1.25)
GFR, EST AFRICAN AMERICAN: 94 mL/min/{1.73_m2} (ref >=60)
GFR, EST NON AFRICAN AMERICAN: 81 mL/min/{1.73_m2} (ref >=60)
Globulin: 2.1 g/dL (calc) (ref 1.9–3.7)
Glucose, Bld: 98 mg/dL (ref 65–99)
Potassium: 4.2 mmol/L (ref 3.5–5.3)
Sodium: 143 mmol/L (ref 135–146)
Total Bilirubin: 0.3 mg/dL (ref 0.2–1.2)
Total Protein: 5.8 g/dL — ABNORMAL LOW (ref 6.1–8.1)

## 2018-08-21 LAB — PSA: PSA: 0.9 ng/mL (ref <=4.0)

## 2018-08-24 ENCOUNTER — Encounter: Payer: Self-pay | Admitting: Internal Medicine

## 2018-08-24 ENCOUNTER — Ambulatory Visit (INDEPENDENT_AMBULATORY_CARE_PROVIDER_SITE_OTHER): Payer: BLUE CROSS/BLUE SHIELD | Admitting: Internal Medicine

## 2018-08-24 VITALS — BP 120/70 | HR 71 | Ht 67.0 in | Wt 159.0 lb

## 2018-08-24 DIAGNOSIS — F39 Unspecified mood [affective] disorder: Secondary | ICD-10-CM

## 2018-08-24 DIAGNOSIS — E7800 Pure hypercholesterolemia, unspecified: Secondary | ICD-10-CM

## 2018-08-24 DIAGNOSIS — Z0000 Encounter for general adult medical examination without abnormal findings: Secondary | ICD-10-CM | POA: Diagnosis not present

## 2018-08-24 DIAGNOSIS — Z23 Encounter for immunization: Secondary | ICD-10-CM | POA: Diagnosis not present

## 2018-08-24 DIAGNOSIS — Z86010 Personal history of colonic polyps: Secondary | ICD-10-CM

## 2018-08-24 LAB — POCT URINALYSIS DIPSTICK
APPEARANCE: NORMAL
BILIRUBIN UA: NEGATIVE
Glucose, UA: NEGATIVE
KETONES UA: NEGATIVE
Leukocytes, UA: NEGATIVE
Nitrite, UA: NEGATIVE
ODOR: NORMAL
Protein, UA: NEGATIVE
Spec Grav, UA: 1.01 (ref 1.010–1.025)
Urobilinogen, UA: 0.2 E.U./dL
pH, UA: 6.5 (ref 5.0–8.0)
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
## 2018-09-05 ENCOUNTER — Other Ambulatory Visit: Payer: Self-pay | Admitting: Internal Medicine

## 2018-09-06 NOTE — Patient Instructions (Addendum)
Flu vaccine given.  Continue statin medication and Wellbutrin.  Return in 6 months for follow-up on hyperlipidemia or as needed.

## 2018-10-10 ENCOUNTER — Other Ambulatory Visit: Payer: Self-pay | Admitting: Internal Medicine

## 2018-12-09 ENCOUNTER — Telehealth: Payer: Self-pay | Admitting: Internal Medicine

## 2018-12-09 NOTE — Telephone Encounter (Signed)
Spoke with patient and provided appointment for 1/30 @ 10:30 as a work-in.  Instructions provided for clear liquid diet, etc. Per Dr. Verlene Mayer instructions.  Patient confirmed.

## 2018-12-09 NOTE — Telephone Encounter (Signed)
See Tomorrow morning. Clear liquids, Tylenol for fever. Will need CBC. Likely viral. Can do stool studies if he wants to.

## 2018-12-09 NOTE — Telephone Encounter (Signed)
Patient states that he has had fever, chills, and diarrhea since Monday.  I told him that I would get a message to you and call him back regarding an appointment.    Phone:  [phone]  Thank you.

## 2018-12-10 ENCOUNTER — Ambulatory Visit: Payer: BLUE CROSS/BLUE SHIELD | Admitting: Internal Medicine

## 2018-12-10 VITALS — BP 110/70 | HR 88 | Temp 98.3°F | Ht 67.0 in | Wt 152.0 lb

## 2018-12-10 DIAGNOSIS — R8290 Unspecified abnormal findings in urine: Secondary | ICD-10-CM | POA: Diagnosis not present

## 2018-12-10 DIAGNOSIS — R300 Dysuria: Secondary | ICD-10-CM

## 2018-12-10 DIAGNOSIS — R059 Cough, unspecified: Secondary | ICD-10-CM

## 2018-12-10 DIAGNOSIS — R319 Hematuria, unspecified: Secondary | ICD-10-CM

## 2018-12-10 DIAGNOSIS — R05 Cough: Secondary | ICD-10-CM

## 2018-12-10 DIAGNOSIS — Z711 Person with feared health complaint in whom no diagnosis is made: Secondary | ICD-10-CM

## 2018-12-10 DIAGNOSIS — R509 Fever, unspecified: Secondary | ICD-10-CM | POA: Diagnosis not present

## 2018-12-10 DIAGNOSIS — K529 Noninfective gastroenteritis and colitis, unspecified: Secondary | ICD-10-CM

## 2018-12-10 LAB — POCT URINALYSIS DIPSTICK
APPEARANCE: ABNORMAL
BILIRUBIN UA: NEGATIVE
GLUCOSE UA: NEGATIVE
Ketones, UA: NEGATIVE
Nitrite, UA: POSITIVE
ODOR: ABNORMAL
PH UA: 6 (ref 5.0–8.0)
Protein, UA: POSITIVE — AB
Spec Grav, UA: 1.015 (ref 1.010–1.025)
Urobilinogen, UA: 0.2 E.U./dL

## 2018-12-10 LAB — POCT INFLUENZA A/B
Influenza A, POC: NEGATIVE
Influenza B, POC: NEGATIVE

## 2018-12-10 MED ORDER — CIPROFLOXACIN HCL 500 MG PO TABS: 500.0000 mg | ORAL_TABLET | Freq: Two times a day (BID) | ORAL | 0 refills | Status: DC

## 2018-12-10 NOTE — Progress Notes (Signed)
   Subjective:    Patient ID: Robert Mora, male    DOB: Nov 25, 1955, 63 y.o.   MRN: [id number]  HPI 63 year old Male with onset of diarrhea Monday January 27. Had been to visit mother who was recently released from rehab facility over the weekend. Now has developed dysuria. Has had fever and shaking chills.  He has a long-term male partner who has been experimenting with sex with other men at parties and may or may not have been using protection but takes Truvada after these parties.  Review of Systems diarrhea seems to be slowly improving.  Has been watching his diet.  Has had malaise and fatigue.     Objective:   Physical Exam VS reviewed. Urine dipstick shows occult blood. Culture sent. Abdomen soft nondistended without masses or tenderness.     Assessment & Plan:  Gastroenteritis-continue to advance diet slowly  Acute UTI-urine culture obtained  Plan: Screening for STDs.  Urine culture pending.  Started on Cipro 500 mg twice daily for 10 days.  Addendum: Screening for STDs is negative.  Urine culture is pending.  Microscopic UA performed at lab is abnormal.

## 2018-12-11 ENCOUNTER — Encounter: Payer: Self-pay | Admitting: Internal Medicine

## 2018-12-11 LAB — CBC WITH DIFFERENTIAL/PLATELET
ABSOLUTE MONOCYTES: 907 {cells}/uL (ref 200–950)
Basophils Absolute: 34 cells/uL (ref 0–200)
Basophils Relative: 0.3 %
Eosinophils Absolute: 123 cells/uL (ref 15–500)
Eosinophils Relative: 1.1 %
HCT: 38.1 % — ABNORMAL LOW (ref 38.5–50.0)
HEMOGLOBIN: 13.4 g/dL (ref 13.2–17.1)
Lymphs Abs: 1400 cells/uL (ref 850–3900)
MCH: 32.7 pg (ref 27.0–33.0)
MCHC: 35.2 g/dL (ref 32.0–36.0)
MCV: 92.9 fL (ref 80.0–100.0)
MPV: 11.2 fL (ref 7.5–12.5)
Monocytes Relative: 8.1 %
Neutro Abs: 8736 cells/uL — ABNORMAL HIGH (ref 1500–7800)
Neutrophils Relative %: 78 %
Platelets: 173 10*3/uL (ref 140–400)
RBC: 4.1 10*6/uL — AB (ref 4.20–5.80)
RDW: 11.9 % (ref 11.0–15.0)
Total Lymphocyte: 12.5 %
WBC: 11.2 10*3/uL — ABNORMAL HIGH (ref 3.8–10.8)

## 2018-12-11 LAB — C. TRACHOMATIS/N. GONORRHOEAE RNA
C. trachomatis RNA, TMA: NOT DETECTED
N. gonorrhoeae RNA, TMA: NOT DETECTED

## 2018-12-11 LAB — HIV ANTIBODY (ROUTINE TESTING W REFLEX): HIV 1&2 Ab, 4th Generation: NONREACTIVE

## 2018-12-11 LAB — RPR: RPR Ser Ql: NONREACTIVE

## 2018-12-11 NOTE — Patient Instructions (Signed)
Cipro 500 mg twice daily for 10 days.  Urine culture pending.  Advance diet slowly.  STD testing performed.

## 2018-12-12 LAB — URINE CULTURE
MICRO NUMBER:: 129763
SPECIMEN QUALITY:: ADEQUATE

## 2018-12-12 LAB — URINALYSIS, MICROSCOPIC ONLY
Hyaline Cast: NONE SEEN /LPF
RBC / HPF: >=60 /HPF — AB (ref 0–2)
Squamous Epithelial / HPF: NONE SEEN /HPF (ref <=5)
WBC, UA: >=60 /HPF — AB (ref 0–5)

## 2018-12-25 ENCOUNTER — Encounter: Payer: Self-pay | Admitting: Internal Medicine

## 2018-12-25 ENCOUNTER — Ambulatory Visit (INDEPENDENT_AMBULATORY_CARE_PROVIDER_SITE_OTHER): Payer: BLUE CROSS/BLUE SHIELD | Admitting: Internal Medicine

## 2018-12-25 VITALS — BP 110/64 | HR 62 | Ht 67.0 in

## 2018-12-25 DIAGNOSIS — B9620 Unspecified Escherichia coli [E. coli] as the cause of diseases classified elsewhere: Secondary | ICD-10-CM | POA: Diagnosis not present

## 2018-12-25 DIAGNOSIS — R319 Hematuria, unspecified: Secondary | ICD-10-CM

## 2018-12-25 DIAGNOSIS — N390 Urinary tract infection, site not specified: Secondary | ICD-10-CM

## 2018-12-25 DIAGNOSIS — R8290 Unspecified abnormal findings in urine: Secondary | ICD-10-CM | POA: Diagnosis not present

## 2018-12-25 LAB — POCT URINALYSIS DIPSTICK
Appearance: NEGATIVE
Bilirubin, UA: NEGATIVE
Glucose, UA: NEGATIVE
Ketones, UA: NEGATIVE
Leukocytes, UA: NEGATIVE
Nitrite, UA: NEGATIVE
ODOR: NEGATIVE
PH UA: 6.5 (ref 5.0–8.0)
Protein, UA: NEGATIVE
Spec Grav, UA: 1.01 (ref 1.010–1.025)
UROBILINOGEN UA: 0.2 U/dL

## 2018-12-26 LAB — TIQ- MISLABELED: Test Ordered On Req: 8563

## 2018-12-26 LAB — UNLABELED: Test Ordered On Req: 395

## 2019-01-02 NOTE — Progress Notes (Signed)
   Subjective:    Patient ID: Robert Mora, male    DOB: 26-Jan-1956, 63 y.o.   MRN: [id number]  HPI Here today to follow-up on recent urinary tract infection which was diagnosed on January 30.  He was treated with Cipro for 10 days.  Recent testing for HIV, chlamydia, gonorrhea and RPR were negative.  Urine culture on January 30 grew E. coli greater than 100,000 colonies per milliliter sensitive to Cipro.  Repeat urine specimen today shows large occult blood.  He has a prior history of microscopic hematuria.  Urine was sent for microscopic evaluation.  No red blood cells were seen on specimen sent to lab.  Repeat urine culture showed no growth.    Review of Systems see above feels well with no new complaints     Objective:   Physical Exam  Not examined but spent 10 minutes speaking with him about the symptoms he had, diagnosis of E. coli UTI, and treatment for UTI.  He is feeling much better.  Also reviewed with him STD testing which was negative.        Assessment & Plan:  E. coli UTI-resolved  STD testing negative  Plan: 35-month follow-up on hyperlipidemia due in April.  Has appointment.

## 2019-01-02 NOTE — Patient Instructions (Signed)
Recent STD testing was negative.  E. coli UTI resolved.  Return in April for follow-up on hyperlipidemia.

## 2019-01-26 LAB — PAT ID TIQ DOC: Test Affected: 395

## 2019-01-26 LAB — URINE CULTURE
MICRO NUMBER:: 208950
Result:: NO GROWTH
SPECIMEN QUALITY:: ADEQUATE

## 2019-01-26 LAB — URINALYSIS, MICROSCOPIC ONLY
Bacteria, UA: NONE SEEN /HPF
Hyaline Cast: NONE SEEN /LPF
RBC / HPF: NONE SEEN /HPF (ref 0–2)
Squamous Epithelial / HPF: NONE SEEN /HPF (ref <=5)
WBC, UA: NONE SEEN /HPF (ref 0–5)

## 2019-02-26 ENCOUNTER — Other Ambulatory Visit: Payer: Self-pay

## 2019-02-26 ENCOUNTER — Other Ambulatory Visit: Payer: BLUE CROSS/BLUE SHIELD | Admitting: Internal Medicine

## 2019-02-26 VITALS — Temp 98.3°F

## 2019-02-26 DIAGNOSIS — E7800 Pure hypercholesterolemia, unspecified: Secondary | ICD-10-CM

## 2019-02-27 LAB — HEPATIC FUNCTION PANEL
AG Ratio: 2 (calc) (ref 1.0–2.5)
ALT: 12 U/L (ref 9–46)
AST: 20 U/L (ref 10–35)
Albumin: 3.9 g/dL (ref 3.6–5.1)
Alkaline phosphatase (APISO): 76 U/L (ref 35–144)
Bilirubin, Direct: 0.1 mg/dL (ref 0.0–0.2)
Globulin: 2 g/dL (calc) (ref 1.9–3.7)
Indirect Bilirubin: 0.3 mg/dL (calc) (ref 0.2–1.2)
Total Bilirubin: 0.4 mg/dL (ref 0.2–1.2)
Total Protein: 5.9 g/dL — ABNORMAL LOW (ref 6.1–8.1)

## 2019-02-27 LAB — LIPID PANEL
Cholesterol: 178 mg/dL (ref <200)
HDL: 48 mg/dL (ref >=40)
LDL Cholesterol (Calc): 111 mg/dL (calc) — ABNORMAL HIGH
Non-HDL Cholesterol (Calc): 130 mg/dL (calc) — ABNORMAL HIGH (ref <130)
Total CHOL/HDL Ratio: 3.7 (calc) (ref <5.0)
Triglycerides: 89 mg/dL (ref <150)

## 2019-03-01 ENCOUNTER — Ambulatory Visit (INDEPENDENT_AMBULATORY_CARE_PROVIDER_SITE_OTHER): Payer: BLUE CROSS/BLUE SHIELD | Admitting: Internal Medicine

## 2019-03-01 DIAGNOSIS — E7800 Pure hypercholesterolemia, unspecified: Secondary | ICD-10-CM | POA: Diagnosis not present

## 2019-03-01 MED ORDER — HYDROCODONE-ACETAMINOPHEN 5-325 MG PO TABS: 1.0000 | ORAL_TABLET | Freq: Four times a day (QID) | ORAL | 0 refills | Status: DC | PRN

## 2019-03-01 NOTE — Progress Notes (Signed)
   Subjective:    Patient ID: Robert Mora, male    DOB: 03/05/1956, 63 y.o.   MRN: [id number]  HPI 63 year old male seen today by interactive video and audio telecommunications achieved between this provider and patient due to the coronavirus pandemic.  He gives consent for this form on visit today.  He is identified as Robert Mora a longstanding patient in this practice.  He had physical exam here in October and he is generally seen here every 6 months for follow-up on hyperlipidemia.  He takes Wellbutrin to keep him from smoking and for mood.  He is on Crestor 10 mg daily.  His general health is good.  He has not been working recently due to the pandemic.  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  His live-in partner, Corene Cornea, is a Theme park manager and also has been out of work.  He has no new complaints.  His total cholesterol is 178, HDL cholesterol 48, triglycerides 89 and LDL cholesterol 111.  Liver functions are normal.  In October her LDL was 118.  He does exercise.  His HDL is actually a little higher at 48 and it was in October at 82.    Review of Systems No new complaints    Objective:   Physical Exam Patient reports his weight is stable.  Not examined due to format today       Assessment & Plan:  Pure hypercholesterolemia  Plan: Continue Crestor 10 mg daily and follow-up with physical exam in 6 months.  Continue to watch diet and get some exercise.  20 minutes spent with interactive audio and video telecommunications and the patient to reviewing lab work including explanation of lab results.

## 2019-03-03 NOTE — Progress Notes (Signed)
Labs will need to have virtual visit at a later time when labs are returned

## 2019-03-11 ENCOUNTER — Encounter: Payer: Self-pay | Admitting: Internal Medicine

## 2019-03-11 NOTE — Patient Instructions (Signed)
Your lipid panel and liver functions are stable.  Please continue same dose of Crestor and follow-up with physical examination in 6 months.  It was a pleasure to see you today.

## 2019-03-23 ENCOUNTER — Other Ambulatory Visit: Payer: Self-pay

## 2019-03-23 ENCOUNTER — Ambulatory Visit: Payer: BLUE CROSS/BLUE SHIELD | Admitting: Internal Medicine

## 2019-03-23 ENCOUNTER — Telehealth: Payer: Self-pay | Admitting: Internal Medicine

## 2019-03-23 ENCOUNTER — Encounter: Payer: Self-pay | Admitting: Internal Medicine

## 2019-03-23 VITALS — BP 102/70 | HR 68 | Temp 97.8°F | Ht 67.0 in | Wt 152.0 lb

## 2019-03-23 DIAGNOSIS — L02435 Carbuncle of right lower limb: Secondary | ICD-10-CM

## 2019-03-23 NOTE — Telephone Encounter (Signed)
Robert Mora [phone]  [name] called to say that he has a sore on his leg for 2 weeks, he has been trying to take care of it with keeping it covered with bandage and and antibiotic ointment. Okay with virtual visit.

## 2019-03-23 NOTE — Telephone Encounter (Signed)
OV here

## 2019-03-23 NOTE — Telephone Encounter (Signed)
Scheduled

## 2019-03-25 ENCOUNTER — Encounter: Payer: Self-pay | Admitting: Internal Medicine

## 2019-03-25 MED ORDER — DOXYCYCLINE HYCLATE 100 MG PO TABS: 100.0000 mg | ORAL_TABLET | Freq: Two times a day (BID) | ORAL | 0 refills | Status: DC

## 2019-03-25 NOTE — Patient Instructions (Signed)
Tetanus immunization is up-to-date.  Cleaned with peroxide and Bactroban ointment applied.  He will do this twice daily at home and cover with light dressing.  Call if not improving in 3 to 4 days or sooner if worse.

## 2019-03-25 NOTE — Progress Notes (Signed)
   Subjective:    Patient ID: Robert Mora, male    DOB: 03/22/1956, 63 y.o.   MRN: [id number]  HPI 63 year old Male presents today with lesion right mid shin that has been present for several days and is not healing.  He is seen in person in the office.  He denies any known injury to that area.  Says lesion just appeared fairly suddenly.  Does not recall being bitten by any insect or any break in the skin for any reason.  His general health is good.  Says it presented initially as a blisterlike lesion.  He has been doing some yard work.    Review of Systems see above no fever or chills.  No drainage from lesion.  Mild pain.     Objective:   Physical Exam  Approximately quarter sized circular lesion right mid shin with superficial fat exposed.  There is no drainage from the lesion and no surrounding erythema.      Assessment & Plan:  Carbuncle right mid shin.  This was cleaned with peroxide and dressed with Bactroban today.  He will do the same time twice daily.  We will place him on doxycycline 100 mg twice daily for 10 days.  Time spent with patient including evaluation and medical decision making of carbuncle treatment and time spent dressing wound in sterile fashion is 20 minutes.  Explained to him that this could be an insect bite such as brown recluse spider.  He does not recall any incident or accident that would have caused this.  He will call if not improving.  Tetanus immunization is up-to-date.

## 2019-06-14 ENCOUNTER — Other Ambulatory Visit: Payer: Self-pay

## 2019-06-14 ENCOUNTER — Encounter (HOSPITAL_COMMUNITY): Payer: Self-pay

## 2019-06-14 ENCOUNTER — Telehealth: Payer: Self-pay | Admitting: Internal Medicine

## 2019-06-14 ENCOUNTER — Emergency Department (HOSPITAL_COMMUNITY): Payer: Medicare Other

## 2019-06-14 ENCOUNTER — Emergency Department (HOSPITAL_COMMUNITY)
Admission: EM | Admit: 2019-06-14 | Discharge: 2019-06-14 | Disposition: A | Discharge status: Home / Self Care | Payer: Medicare Other | Attending: Emergency Medicine | Admitting: Emergency Medicine

## 2019-06-14 DIAGNOSIS — Z20828 Contact with and (suspected) exposure to other viral communicable diseases: Secondary | ICD-10-CM | POA: Diagnosis not present

## 2019-06-14 DIAGNOSIS — R509 Fever, unspecified: Secondary | ICD-10-CM | POA: Diagnosis not present

## 2019-06-14 DIAGNOSIS — F17210 Nicotine dependence, cigarettes, uncomplicated: Secondary | ICD-10-CM | POA: Insufficient documentation

## 2019-06-14 DIAGNOSIS — R5383 Other fatigue: Secondary | ICD-10-CM | POA: Diagnosis not present

## 2019-06-14 DIAGNOSIS — Z79899 Other long term (current) drug therapy: Secondary | ICD-10-CM | POA: Insufficient documentation

## 2019-06-14 LAB — SARS CORONAVIRUS 2 BY RT PCR (HOSPITAL ORDER, PERFORMED IN ~~LOC~~ HOSPITAL LAB): SARS Coronavirus 2: NEGATIVE

## 2019-06-14 LAB — COMPREHENSIVE METABOLIC PANEL
ALT: 13 U/L (ref 0–44)
AST: 19 U/L (ref 15–41)
Albumin: 3.9 g/dL (ref 3.5–5.0)
Alkaline Phosphatase: 75 U/L (ref 38–126)
Anion gap: 10 (ref 5–15)
BUN: 14 mg/dL (ref 8–23)
CO2: 25 mmol/L (ref 22–32)
Calcium: 8.8 mg/dL — ABNORMAL LOW (ref 8.9–10.3)
Chloride: 104 mmol/L (ref 98–111)
Creatinine, Ser: 0.94 mg/dL (ref 0.61–1.24)
GFR calc Af Amer: >60 mL/min (ref >60)
GFR calc non Af Amer: >60 mL/min (ref >60)
Glucose, Bld: 87 mg/dL (ref 70–99)
Potassium: 4 mmol/L (ref 3.5–5.1)
Sodium: 139 mmol/L (ref 135–145)
Total Bilirubin: 0.6 mg/dL (ref 0.3–1.2)
Total Protein: 7.3 g/dL (ref 6.5–8.1)

## 2019-06-14 LAB — URINALYSIS, ROUTINE W REFLEX MICROSCOPIC
Bacteria, UA: NONE SEEN
Bilirubin Urine: NEGATIVE
Glucose, UA: NEGATIVE mg/dL
Ketones, ur: 5 mg/dL — AB
Leukocytes,Ua: NEGATIVE
Nitrite: NEGATIVE
Protein, ur: NEGATIVE mg/dL
Specific Gravity, Urine: 1.026 (ref 1.005–1.030)
pH: 5 (ref 5.0–8.0)

## 2019-06-14 LAB — CBC WITH DIFFERENTIAL/PLATELET
Abs Immature Granulocytes: 0.03 10*3/uL (ref 0.00–0.07)
Basophils Absolute: 0 10*3/uL (ref 0.0–0.1)
Basophils Relative: 0 %
Eosinophils Absolute: 0.1 10*3/uL (ref 0.0–0.5)
Eosinophils Relative: 1 %
HCT: 42.8 % (ref 39.0–52.0)
Hemoglobin: 14.1 g/dL (ref 13.0–17.0)
Immature Granulocytes: 0 %
Lymphocytes Relative: 15 %
Lymphs Abs: 1.5 10*3/uL (ref 0.7–4.0)
MCH: 32.7 pg (ref 26.0–34.0)
MCHC: 32.9 g/dL (ref 30.0–36.0)
MCV: 99.3 fL (ref 80.0–100.0)
Monocytes Absolute: 0.9 10*3/uL (ref 0.1–1.0)
Monocytes Relative: 9 %
Neutro Abs: 7.7 10*3/uL (ref 1.7–7.7)
Neutrophils Relative %: 75 %
Platelets: 159 10*3/uL (ref 150–400)
RBC: 4.31 MIL/uL (ref 4.22–5.81)
RDW: 12.8 % (ref 11.5–15.5)
WBC: 10.1 10*3/uL (ref 4.0–10.5)
nRBC: 0 % (ref 0.0–0.2)

## 2019-06-14 LAB — RAPID URINE DRUG SCREEN, HOSP PERFORMED
Amphetamines: NOT DETECTED
Barbiturates: NOT DETECTED
Benzodiazepines: NOT DETECTED
Cocaine: NOT DETECTED
Opiates: NOT DETECTED
Tetrahydrocannabinol: NOT DETECTED

## 2019-06-14 LAB — LACTIC ACID, PLASMA: Lactic Acid, Venous: 0.9 mmol/L (ref 0.5–1.9)
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
## 2019-06-14 NOTE — Telephone Encounter (Signed)
Best for him to go to [facility] to be checked out ASAP

## 2019-06-14 NOTE — Discharge Instructions (Addendum)
1.  At this time your lab work is normal.  Your vital signs are normal. 2.  Because you have had fever and generalized symptoms, self isolate.  Even though your coronavirus testing is negative it is possible to have coronavirus. 3.  You have also had HIV, gonorrhea and Chlamydia testing done.  These results may take several days.  At this time, you do not have any specific symptoms of any of these conditions.  Follow-up with your doctor to discuss any risk factors and further testing if needed. 4.  Return to the emergency department if you develop worsening or new symptoms.    Person Under Monitoring Name: Robert Mora  Location: [place] [address]   Infection Prevention Recommendations for Individuals Confirmed to have, or Being Evaluated for, 2019 Novel Coronavirus (COVID-19) Infection Who Receive Care at Home  Individuals who are confirmed to have, or are being evaluated for, COVID-19 should follow the prevention steps below until a healthcare provider or local or state health department says they can return to normal activities.  Stay home except to get medical care You should restrict activities outside your home, except for getting medical care. Do not go to work, school, or public areas, and do not use public transportation or taxis.  Call ahead before visiting your doctor Before your medical appointment, call the healthcare provider and tell them that you have, or are being evaluated for, COVID-19 infection. This will help the healthcare providers office take steps to keep other people from getting infected. Ask your healthcare provider to call the local or state health department.  Monitor your symptoms Seek prompt medical attention if your illness is worsening (e.g., difficulty breathing). Before going to your medical appointment, call the healthcare provider and tell them that you have, or are being evaluated for, COVID-19 infection. Ask your healthcare  provider to call the local or state health department.  Wear a facemask You should wear a facemask that covers your nose and mouth when you are in the same room with other people and when you visit a healthcare provider. People who live with or visit you should also wear a facemask while they are in the same room with you.  Separate yourself from other people in your home As much as possible, you should stay in a different room from other people in your home. Also, you should use a separate bathroom, if available.  Avoid sharing household items You should not share dishes, drinking glasses, cups, eating utensils, towels, bedding, or other items with other people in your home. After using these items, you should wash them thoroughly with soap and water.  Cover your coughs and sneezes Cover your mouth and nose with a tissue when you cough or sneeze, or you can cough or sneeze into your sleeve. Throw used tissues in a lined trash can, and immediately wash your hands with soap and water for at least 20 seconds or use an alcohol-based hand rub.  Wash your [facility] your hands often and thoroughly with soap and water for at least 20 seconds. You can use an alcohol-based hand sanitizer if soap and water are not available and if your hands are not visibly dirty. Avoid touching your eyes, nose, and mouth with unwashed hands.   Prevention Steps for Caregivers and Household Members of Individuals Confirmed to have, or Being Evaluated for, COVID-19 Infection Being Cared for in the Home  If you live with, or provide care at home for,  a person confirmed to have, or being evaluated for, COVID-19 infection please follow these guidelines to prevent infection:  Follow healthcare providers instructions Make sure that you understand and can help the patient follow any healthcare provider instructions for all care.  Provide for the patients basic needs You should help the patient with basic needs  in the home and provide support for getting groceries, prescriptions, and other personal needs.  Monitor the patients symptoms If they are getting sicker, call his or her medical provider and tell them that the patient has, or is being evaluated for, COVID-19 infection. This will help the healthcare providers office take steps to keep other people from getting infected. Ask the healthcare provider to call the local or state health department.  Limit the number of people who have contact with the patient If possible, have only one caregiver for the patient. Other household members should stay in another home or place of residence. If this is not possible, they should stay in another room, or be separated from the patient as much as possible. Use a separate bathroom, if available. Restrict visitors who do not have an essential need to be in the home.  Keep older adults, very young children, and other sick people away from the patient Keep older adults, very young children, and those who have compromised immune systems or chronic health conditions away from the patient. This includes people with chronic heart, lung, or kidney conditions, diabetes, and cancer.  Ensure good ventilation Make sure that shared spaces in the home have good air flow, such as from an air conditioner or an opened window, weather permitting.  Wash your hands often Wash your hands often and thoroughly with soap and water for at least 20 seconds. You can use an alcohol based hand sanitizer if soap and water are not available and if your hands are not visibly dirty. Avoid touching your eyes, nose, and mouth with unwashed hands. Use disposable paper towels to dry your hands. If not available, use dedicated cloth towels and replace them when they become wet.  Wear a facemask and gloves Wear a disposable facemask at all times in the room and gloves when you touch or have contact with the patients blood, body fluids,  and/or secretions or excretions, such as sweat, saliva, sputum, nasal mucus, vomit, urine, or feces.  Ensure the mask fits over your nose and mouth tightly, and do not touch it during use. Throw out disposable facemasks and gloves after using them. Do not reuse. Wash your hands immediately after removing your facemask and gloves. If your personal clothing becomes contaminated, carefully remove clothing and launder. Wash your hands after handling contaminated clothing. Place all used disposable facemasks, gloves, and other waste in a lined container before disposing them with other household waste. Remove gloves and wash your hands immediately after handling these items.  Do not share dishes, glasses, or other household items with the patient Avoid sharing household items. You should not share dishes, drinking glasses, cups, eating utensils, towels, bedding, or other items with a patient who is confirmed to have, or being evaluated for, COVID-19 infection. After the person uses these items, you should wash them thoroughly with soap and water.  Wash laundry thoroughly Immediately remove and wash clothes or bedding that have blood, body fluids, and/or secretions or excretions, such as sweat, saliva, sputum, nasal mucus, vomit, urine, or feces, on them. Wear gloves when handling laundry from the patient. Read and follow directions on labels of laundry  or clothing items and detergent. In general, wash and dry with the warmest temperatures recommended on the label.  Clean all areas the individual has used often Clean all touchable surfaces, such as counters, tabletops, doorknobs, bathroom fixtures, toilets, phones, keyboards, tablets, and bedside tables, every day. Also, clean any surfaces that may have blood, body fluids, and/or secretions or excretions on them. Wear gloves when cleaning surfaces the patient has come in contact with. Use a diluted bleach solution (e.g., dilute bleach with 1 part bleach  and 10 parts water) or a household disinfectant with a label that says EPA-registered for coronaviruses. To make a bleach solution at home, add 1 tablespoon of bleach to 1 quart (4 cups) of water. For a larger supply, add  cup of bleach to 1 gallon (16 cups) of water. Read labels of cleaning products and follow recommendations provided on product labels. Labels contain instructions for safe and effective use of the cleaning product including precautions you should take when applying the product, such as wearing gloves or eye protection and making sure you have good ventilation during use of the product. Remove gloves and wash hands immediately after cleaning.  Monitor yourself for signs and symptoms of illness Caregivers and household members are considered close contacts, should monitor their health, and will be asked to limit movement outside of the home to the extent possible. Follow the monitoring steps for close contacts listed on the symptom monitoring form.   ? If you have additional questions, contact your local health department or call the epidemiologist on call at [phone] (available 24/7). ? This guidance is subject to change. For the most up-to-date guidance from [facility], please refer to their website: [email or web address]

## 2019-06-14 NOTE — ED Triage Notes (Signed)
Pt comes from home. Pt had fever of 103, achy, fatigue, lethargic. Denies cough, N/V/D, urinary issues. Started yesterday

## 2019-06-14 NOTE — Telephone Encounter (Signed)
Robert Mora [phone]  Robert Mora called to say that last night he started running fever (104.6), chills, headache and leg pain, just a little bit ago fever was 100.6

## 2019-06-14 NOTE — Telephone Encounter (Signed)
Called patient to let him know Dr Verlene Mayer response, he verbalized understanding and said he would go immediatly to [facility].

## 2019-06-15 ENCOUNTER — Telehealth: Payer: Self-pay

## 2019-06-15 LAB — HIV ANTIBODY (ROUTINE TESTING W REFLEX): HIV Screen 4th Generation wRfx: NONREACTIVE

## 2019-06-15 LAB — RPR: RPR Ser Ql: NONREACTIVE

## 2019-06-15 NOTE — Telephone Encounter (Signed)
Tell him to call us later in the week with progress report. If feeling better does not need visit. I have looked at him labs from the ED

## 2019-06-15 NOTE — Telephone Encounter (Signed)
Called to check on patient after been at the emergency room yesterday.

## 2019-06-15 NOTE — Telephone Encounter (Signed)
Spoke with patient stated he is asymptomatic and he feels better, he said his temperature is 99.2. He said he was advised to follow up with you within 4 days. He said he was advised to take tylenol for the temperature. COVID19 was negative.

## 2019-06-16 LAB — URINE CULTURE: Culture: <10000 — AB

## 2019-06-18 NOTE — Telephone Encounter (Signed)
Called to check on patient he feels better. He will call back on Monday if he needs to be seen.

## 2019-06-19 LAB — CULTURE, BLOOD (ROUTINE X 2)
Culture: NO GROWTH
Culture: NO GROWTH
Special Requests: ADEQUATE

## 2019-08-27 ENCOUNTER — Other Ambulatory Visit: Payer: Medicare Other | Admitting: Internal Medicine

## 2019-08-27 ENCOUNTER — Other Ambulatory Visit: Payer: Self-pay

## 2019-08-27 DIAGNOSIS — N529 Male erectile dysfunction, unspecified: Secondary | ICD-10-CM

## 2019-08-27 DIAGNOSIS — Z86010 Personal history of colonic polyps: Secondary | ICD-10-CM | POA: Diagnosis not present

## 2019-08-27 DIAGNOSIS — E7800 Pure hypercholesterolemia, unspecified: Secondary | ICD-10-CM | POA: Diagnosis not present

## 2019-08-27 DIAGNOSIS — Z125 Encounter for screening for malignant neoplasm of prostate: Secondary | ICD-10-CM

## 2019-08-27 DIAGNOSIS — F39 Unspecified mood [affective] disorder: Secondary | ICD-10-CM | POA: Diagnosis not present

## 2019-08-27 DIAGNOSIS — Z860101 Personal history of adenomatous and serrated colon polyps: Secondary | ICD-10-CM

## 2019-08-28 LAB — CBC WITH DIFFERENTIAL/PLATELET
Absolute Monocytes: 643 cells/uL (ref 200–950)
Basophils Absolute: 27 cells/uL (ref 0–200)
Basophils Relative: 0.4 %
Eosinophils Absolute: 188 cells/uL (ref 15–500)
Eosinophils Relative: 2.8 %
HCT: 38.6 % (ref 38.5–50.0)
Hemoglobin: 13.2 g/dL (ref 13.2–17.1)
Lymphs Abs: 1782 cells/uL (ref 850–3900)
MCH: 32.5 pg (ref 27.0–33.0)
MCHC: 34.2 g/dL (ref 32.0–36.0)
MCV: 95.1 fL (ref 80.0–100.0)
MPV: 10.9 fL (ref 7.5–12.5)
Monocytes Relative: 9.6 %
Neutro Abs: 4060 cells/uL (ref 1500–7800)
Neutrophils Relative %: 60.6 %
Platelets: 170 10*3/uL (ref 140–400)
RBC: 4.06 10*6/uL — ABNORMAL LOW (ref 4.20–5.80)
RDW: 12.2 % (ref 11.0–15.0)
Total Lymphocyte: 26.6 %
WBC: 6.7 10*3/uL (ref 3.8–10.8)

## 2019-08-28 LAB — COMPLETE METABOLIC PANEL WITH GFR
AG Ratio: 1.9 (calc) (ref 1.0–2.5)
ALT: 9 U/L (ref 9–46)
AST: 16 U/L (ref 10–35)
Albumin: 3.9 g/dL (ref 3.6–5.1)
Alkaline phosphatase (APISO): 70 U/L (ref 35–144)
BUN: 14 mg/dL (ref 7–25)
CO2: 24 mmol/L (ref 20–32)
Calcium: 9.1 mg/dL (ref 8.6–10.3)
Chloride: 108 mmol/L (ref 98–110)
Creat: 0.92 mg/dL (ref 0.70–1.25)
GFR, Est African American: 101 mL/min/{1.73_m2} (ref >=60)
GFR, Est Non African American: 87 mL/min/{1.73_m2} (ref >=60)
Globulin: 2.1 g/dL (calc) (ref 1.9–3.7)
Glucose, Bld: 95 mg/dL (ref 65–99)
Potassium: 4 mmol/L (ref 3.5–5.3)
Sodium: 140 mmol/L (ref 135–146)
Total Bilirubin: 0.3 mg/dL (ref 0.2–1.2)
Total Protein: 6 g/dL — ABNORMAL LOW (ref 6.1–8.1)

## 2019-08-28 LAB — LIPID PANEL
Cholesterol: 167 mg/dL (ref <200)
HDL: 48 mg/dL (ref >=40)
LDL Cholesterol (Calc): 102 mg/dL (calc) — ABNORMAL HIGH
Non-HDL Cholesterol (Calc): 119 mg/dL (calc) (ref <130)
Total CHOL/HDL Ratio: 3.5 (calc) (ref <5.0)
Triglycerides: 79 mg/dL (ref <150)

## 2019-08-28 LAB — PSA: PSA: 0.5 ng/mL (ref <=4.0)

## 2019-08-30 ENCOUNTER — Encounter: Payer: Self-pay | Admitting: Internal Medicine

## 2019-08-30 ENCOUNTER — Ambulatory Visit (INDEPENDENT_AMBULATORY_CARE_PROVIDER_SITE_OTHER): Payer: Medicare Other | Admitting: Internal Medicine

## 2019-08-30 ENCOUNTER — Other Ambulatory Visit: Payer: Self-pay

## 2019-08-30 VITALS — BP 100/80 | HR 66 | Temp 98.0°F | Ht 67.0 in | Wt 152.0 lb

## 2019-08-30 DIAGNOSIS — Z86010 Personal history of colonic polyps: Secondary | ICD-10-CM

## 2019-08-30 DIAGNOSIS — F39 Unspecified mood [affective] disorder: Secondary | ICD-10-CM | POA: Diagnosis not present

## 2019-08-30 DIAGNOSIS — R311 Benign essential microscopic hematuria: Secondary | ICD-10-CM

## 2019-08-30 DIAGNOSIS — E7800 Pure hypercholesterolemia, unspecified: Secondary | ICD-10-CM

## 2019-08-30 DIAGNOSIS — J3089 Other allergic rhinitis: Secondary | ICD-10-CM

## 2019-08-30 DIAGNOSIS — D692 Other nonthrombocytopenic purpura: Secondary | ICD-10-CM

## 2019-08-30 DIAGNOSIS — Z0000 Encounter for general adult medical examination without abnormal findings: Secondary | ICD-10-CM | POA: Diagnosis not present

## 2019-08-30 DIAGNOSIS — R319 Hematuria, unspecified: Secondary | ICD-10-CM | POA: Diagnosis not present

## 2019-08-30 DIAGNOSIS — Z23 Encounter for immunization: Secondary | ICD-10-CM | POA: Diagnosis not present

## 2019-08-30 DIAGNOSIS — F17200 Nicotine dependence, unspecified, uncomplicated: Secondary | ICD-10-CM

## 2019-08-30 LAB — POCT URINALYSIS DIPSTICK
Appearance: NEGATIVE
Bilirubin, UA: NEGATIVE
Glucose, UA: NEGATIVE
Ketones, UA: NEGATIVE
Leukocytes, UA: NEGATIVE
Nitrite, UA: NEGATIVE
Odor: NEGATIVE
Protein, UA: NEGATIVE
Spec Grav, UA: 1.01 (ref 1.010–1.025)
Urobilinogen, UA: 0.2 E.U./dL
pH, UA: 6.5 (ref 5.0–8.0)
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
## 2019-08-31 ENCOUNTER — Encounter: Payer: Self-pay | Admitting: Internal Medicine

## 2019-08-31 DIAGNOSIS — R311 Benign essential microscopic hematuria: Secondary | ICD-10-CM | POA: Insufficient documentation

## 2019-08-31 LAB — URINALYSIS, MICROSCOPIC ONLY
Bacteria, UA: NONE SEEN /HPF
Hyaline Cast: NONE SEEN /LPF
RBC / HPF: NONE SEEN /HPF (ref 0–2)
Squamous Epithelial / HPF: NONE SEEN /HPF (ref <=5)
WBC, UA: NONE SEEN /HPF (ref 0–5)

## 2019-08-31 NOTE — Patient Instructions (Addendum)
It was a pleasure to see you today.  Flu vaccine given.  Continue current medications and follow-up in 6 months.

## 2019-09-11 ENCOUNTER — Encounter: Payer: Self-pay | Admitting: Internal Medicine

## 2019-09-16 DIAGNOSIS — Z0120 Encounter for dental examination and cleaning without abnormal findings: Secondary | ICD-10-CM | POA: Diagnosis not present

## 2019-09-20 DIAGNOSIS — N402 Nodular prostate without lower urinary tract symptoms: Secondary | ICD-10-CM | POA: Diagnosis not present

## 2019-09-20 DIAGNOSIS — R3121 Asymptomatic microscopic hematuria: Secondary | ICD-10-CM | POA: Diagnosis not present

## 2019-10-12 DIAGNOSIS — N402 Nodular prostate without lower urinary tract symptoms: Secondary | ICD-10-CM | POA: Diagnosis not present

## 2019-10-18 ENCOUNTER — Encounter: Payer: Self-pay | Admitting: Internal Medicine

## 2019-10-18 ENCOUNTER — Other Ambulatory Visit: Payer: Self-pay

## 2019-10-18 ENCOUNTER — Ambulatory Visit (INDEPENDENT_AMBULATORY_CARE_PROVIDER_SITE_OTHER): Payer: Medicare Other | Admitting: Internal Medicine

## 2019-10-18 VITALS — BP 130/70 | HR 78 | Temp 98.0°F | Ht 67.0 in | Wt 152.0 lb

## 2019-10-18 DIAGNOSIS — Z23 Encounter for immunization: Secondary | ICD-10-CM

## 2019-10-18 NOTE — Patient Instructions (Signed)
Patient received a prevnar 13 vaccine, IM, L deltoid.

## 2019-10-18 NOTE — Progress Notes (Signed)
Prevnar 13 given by CMA

## 2019-10-19 DIAGNOSIS — R3121 Asymptomatic microscopic hematuria: Secondary | ICD-10-CM | POA: Diagnosis not present

## 2019-10-19 DIAGNOSIS — N402 Nodular prostate without lower urinary tract symptoms: Secondary | ICD-10-CM | POA: Diagnosis not present

## 2019-10-26 DIAGNOSIS — R3129 Other microscopic hematuria: Secondary | ICD-10-CM | POA: Diagnosis not present

## 2019-10-26 DIAGNOSIS — K5730 Diverticulosis of large intestine without perforation or abscess without bleeding: Secondary | ICD-10-CM | POA: Diagnosis not present

## 2019-10-26 DIAGNOSIS — R3121 Asymptomatic microscopic hematuria: Secondary | ICD-10-CM | POA: Diagnosis not present

## 2019-10-27 ENCOUNTER — Ambulatory Visit (INDEPENDENT_AMBULATORY_CARE_PROVIDER_SITE_OTHER): Payer: Medicare Other

## 2019-10-27 ENCOUNTER — Other Ambulatory Visit: Payer: Self-pay | Admitting: Podiatry

## 2019-10-27 ENCOUNTER — Encounter: Payer: Self-pay | Admitting: Podiatry

## 2019-10-27 ENCOUNTER — Other Ambulatory Visit: Payer: Self-pay

## 2019-10-27 ENCOUNTER — Ambulatory Visit: Payer: Medicare Other | Admitting: Podiatry

## 2019-10-27 VITALS — BP 114/63

## 2019-10-27 DIAGNOSIS — M722 Plantar fascial fibromatosis: Secondary | ICD-10-CM

## 2019-10-27 DIAGNOSIS — M79671 Pain in right foot: Secondary | ICD-10-CM

## 2019-10-27 NOTE — Patient Instructions (Signed)

## 2019-11-02 DIAGNOSIS — R3121 Asymptomatic microscopic hematuria: Secondary | ICD-10-CM | POA: Diagnosis not present

## 2019-11-10 NOTE — Progress Notes (Signed)
Subjective:   Patient ID: Robert Mora, male   DOB: 63 y.o.   MRN: [id number]   HPI Patient states that has been having pain in the bottom of the right heel for about 3 months and it is worse when standing and its been going on over that time and get gradually getting worse.  Patient does not currently smoke and likes to be active   Review of Systems  All other systems reviewed and are negative.       Objective:  Physical Exam Vitals and nursing note reviewed.  Constitutional:      Appearance: He is well-developed.  Pulmonary:     Effort: Pulmonary effort is normal.  Musculoskeletal:        General: Normal range of motion.  Skin:    General: Skin is warm.  Neurological:     Mental Status: He is alert.     Neurovascular status found to be intact muscle strength was found to be adequate range of motion within normal limits.  Patient is found to have exquisite discomfort medial fascial band right at the insertional point tendon calcaneus with inflammation fluid around the medial band     Assessment:  Acute plantar fasciitis right with moderate depression of the arch also noted     Plan:  H&P condition x-ray reviewed and today I injected the fascia 3 mg Kenalog 5 mg Xylocaine applied fascial brace with instructions on usage along with supportive shoes physical therapy and education rendered.  Reappoint for [place] to recheck  X-rays indicate small spur no indications of stress fracture arthritis

## 2019-11-15 ENCOUNTER — Other Ambulatory Visit: Payer: Self-pay

## 2019-11-15 ENCOUNTER — Encounter: Payer: Self-pay | Admitting: Podiatry

## 2019-11-15 ENCOUNTER — Ambulatory Visit: Payer: Medicare Other | Admitting: Podiatry

## 2019-11-15 DIAGNOSIS — M722 Plantar fascial fibromatosis: Secondary | ICD-10-CM

## 2019-11-15 NOTE — Progress Notes (Signed)
Subjective:   Patient ID: Robert Mora, male   DOB: 64 y.o.   MRN: [id number]   HPI Patient presents stating my right heel is feeling much better   ROS      Objective:  Physical Exam  Neurovascular status intact with significant diminishment of discomfort right plantar heel with patient in a narrow heel and a narrow foot structure     Assessment:  Doing well post fascial treatment right     Plan:  Spent a great deal of time educating him on this going over treatment options and at this point I did dispense over-the-counter insole instructed on shoe gear modifications and protecting the heel with his narrow heel and diminished fat pad.  Patient will be seen back to recheck

## 2019-11-25 ENCOUNTER — Telehealth: Payer: Self-pay | Admitting: Internal Medicine

## 2019-11-25 NOTE — Telephone Encounter (Signed)
Received Fax RX request from  [place], [place] Phone:  [phone]  Fax:  [phone]       Medication - buPROPion (WELLBUTRIN XL) 300 MG 24 hr tablet    Last Refill - 05/30/2019  Last OV - 08/30/19  Last CPE - 08/30/19  Next Appointment -

## 2019-11-26 MED ORDER — BUPROPION HCL ER (XL) 300 MG PO TB24: 300.0000 mg | ORAL_TABLET | Freq: Every day | ORAL | 3 refills | Status: DC

## 2019-12-20 ENCOUNTER — Ambulatory Visit: Payer: Medicare Other | Attending: Internal Medicine

## 2019-12-20 DIAGNOSIS — Z23 Encounter for immunization: Secondary | ICD-10-CM

## 2019-12-20 NOTE — Progress Notes (Signed)
   Covid-19 Vaccination Clinic  Name:  Robert Mora    MRN: [id number] DOB: Jun 11, 1956  12/20/2019  Robert Mora was observed post Covid-19 immunization for 15 minutes without incidence. He was provided with Vaccine Information Sheet and instruction to access the V-Safe system.   Robert Mora was instructed to call 911 with any severe reactions post vaccine: Marland Kitchen Difficulty breathing  . Swelling of your face and throat  . A fast heartbeat  . A bad rash all over your body  . Dizziness and weakness    Immunizations Administered    Name Date Dose VIS Date Route   Pfizer COVID-19 Vaccine 12/20/2019  5:02 PM 0.3 mL 10/22/2019 Intramuscular   Manufacturer: [place]   Lot: [id number]   [place]: [id number]

## 2020-01-17 ENCOUNTER — Ambulatory Visit: Payer: Medicare Other | Attending: Internal Medicine

## 2020-01-17 ENCOUNTER — Encounter: Payer: Self-pay | Admitting: Internal Medicine

## 2020-01-17 ENCOUNTER — Ambulatory Visit (INDEPENDENT_AMBULATORY_CARE_PROVIDER_SITE_OTHER): Payer: Medicare Other | Admitting: Internal Medicine

## 2020-01-17 ENCOUNTER — Telehealth: Payer: Self-pay | Admitting: Internal Medicine

## 2020-01-17 VITALS — Ht 67.0 in | Wt 152.0 lb

## 2020-01-17 DIAGNOSIS — Z23 Encounter for immunization: Secondary | ICD-10-CM | POA: Insufficient documentation

## 2020-01-17 DIAGNOSIS — J0100 Acute maxillary sinusitis, unspecified: Secondary | ICD-10-CM | POA: Diagnosis not present

## 2020-01-17 MED ORDER — AZITHROMYCIN 250 MG PO TABS: ORAL_TABLET | ORAL | 0 refills | Status: DC

## 2020-01-17 NOTE — Progress Notes (Signed)
   Covid-19 Vaccination Clinic  Name:  SHON KELTY    MRN: [id number] DOB: 11-01-1956  01/17/2020  Mr. Klahr was observed post Covid-19 immunization for 15 minutes without incident. He was provided with Vaccine Information Sheet and instruction to access the V-Safe system.   Mr. Furnas was instructed to call 911 with any severe reactions post vaccine: Marland Kitchen Difficulty breathing  . Swelling of face and throat  . A fast heartbeat  . A bad rash all over body  . Dizziness and weakness   Immunizations Administered    Name Date Dose VIS Date Route   Pfizer COVID-19 Vaccine 01/17/2020  2:36 PM 0.3 mL 10/22/2019 Intramuscular   Manufacturer: [place]   Lot: [id number]   [place]: [id number]

## 2020-01-17 NOTE — Progress Notes (Signed)
   Subjective:    Patient ID: Robert Mora, male    DOB: 07/11/1956, 64 y.o.   MRN: [id number]  HPI 64 year old Male seen today via interactive audio and video telecommunications due to the Coronavirus pandemic.  He is agreeable to visit in this format today.  He is identified using 2 identifiers as Robert Mora a patient in this practice    He apparently just received second [place] COVID-19 vaccine today shortly before his appointment with me today.  Records indicated that he had an appointment at the [facility] today just prior to this visit with me.    He is complaining of sinusitis symptoms with nasal congestion and maxillary sinus tenderness.    He probably should have deferred his vaccine today given his symptoms.   He denies exposure to COVID-19 recently.  No dysgeusia, fever, chills or myalgias.         Review of Systems see above-also complaining of headache     Objective:   Physical Exam  He reports that he is afebrile.  He is seen today virtually and looks a bit fatigued.      Assessment & Plan:  Acute maxillary sinusitis  Denies COVID-19 exposure-received second vaccine today at [place]  Plan: Patient is to monitor symptoms closely.  Have called in for him Zithromax Z-PAK 2 p.o. day 1 followed by 1 p.o. days 2 through 5.  Rest and drink plenty of fluids.  Call if symptoms do not improve or sooner if worse.

## 2020-01-17 NOTE — Telephone Encounter (Signed)
Robert Mora [phone]  Robert Mora called to say he has headache, drainage in his throat, stuffy head., No covid exposure, no fever,

## 2020-01-17 NOTE — Patient Instructions (Signed)
Rest and drink plenty of fluids.  Take to Zithromax day 1 followed by 1 p.o. days 2 through 5.  Call if not better in 7 to 10 days or sooner if worse.

## 2020-01-17 NOTE — Telephone Encounter (Signed)
[name] had first vaccine on 12/20/19 and is had second vaccine today.

## 2020-01-17 NOTE — Telephone Encounter (Signed)
Scheduled virtual visit °

## 2020-01-17 NOTE — Telephone Encounter (Signed)
Has he been vaccinated?

## 2020-01-26 DIAGNOSIS — Z0120 Encounter for dental examination and cleaning without abnormal findings: Secondary | ICD-10-CM | POA: Diagnosis not present

## 2020-01-27 ENCOUNTER — Telehealth: Payer: Self-pay | Admitting: Internal Medicine

## 2020-01-27 IMAGING — DX PORTABLE CHEST - 1 VIEW
1 series · 1 of 1 positions shown · non-contrast
Comparison: None.

CLINICAL DATA: Fevers and lethargy

EXAM:
PORTABLE CHEST 1 VIEW

[chest ap]
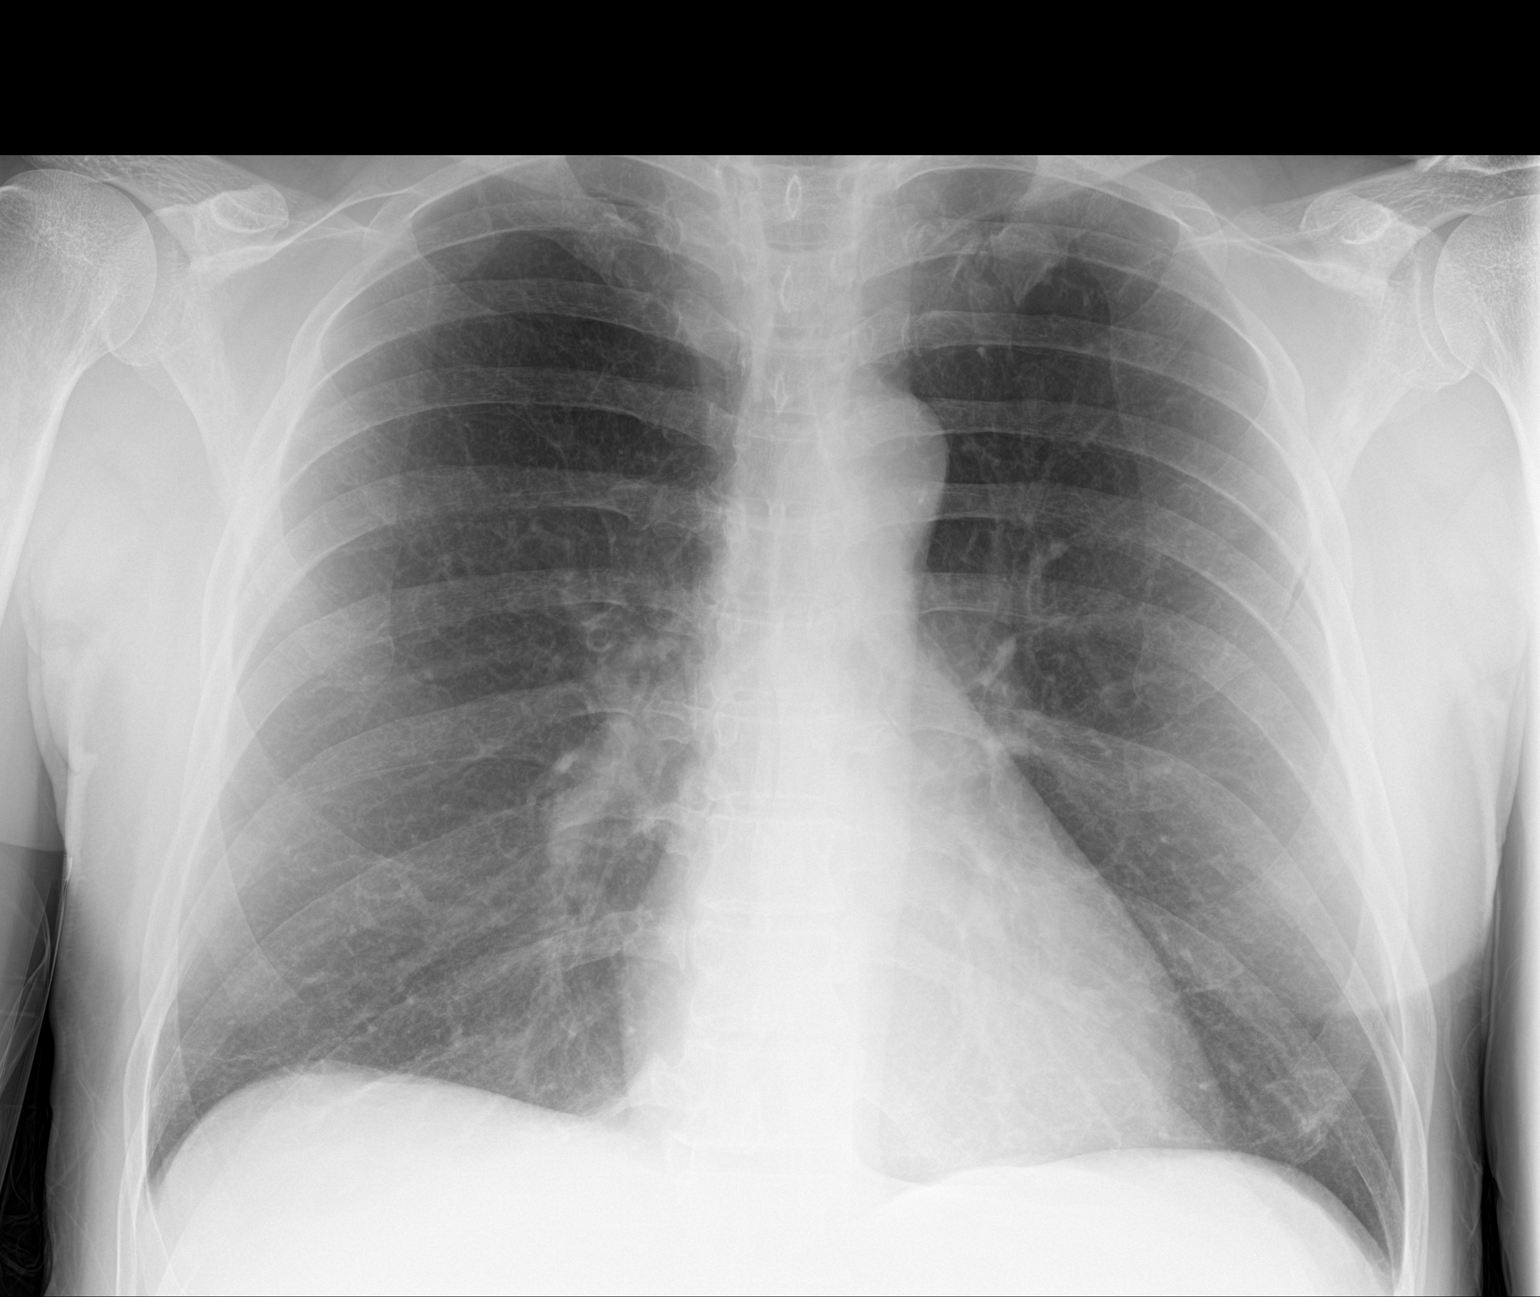

[1 of 1 positions shown; findings below may reference images not displayed]

FINDINGS: The heart size and mediastinal contours are within normal limits.
Both lungs are clear. The visualized skeletal structures are
unremarkable.
IMPRESSION: No active disease.

## 2020-01-27 NOTE — Telephone Encounter (Signed)
[name] Megill [phone]  [name] called to say he is feeling better, still has a little cough nothing like it was, however the coughing caused his back to go out and he would like to get a refill on below medication. I let him know he would need office visit.     HYDROcodone-acetaminophen (NORCO) 5-325 MG tablet

## 2020-01-30 ENCOUNTER — Other Ambulatory Visit: Payer: Self-pay | Admitting: Internal Medicine

## 2020-01-30 DIAGNOSIS — M545 Low back pain, unspecified: Secondary | ICD-10-CM | POA: Insufficient documentation

## 2020-01-30 MED ORDER — HYDROCODONE-ACETAMINOPHEN 5-325 MG PO TABS: 1.0000 | ORAL_TABLET | Freq: Four times a day (QID) | ORAL | 0 refills | Status: DC | PRN

## 2020-01-31 ENCOUNTER — Ambulatory Visit (INDEPENDENT_AMBULATORY_CARE_PROVIDER_SITE_OTHER): Payer: Medicare Other | Admitting: Internal Medicine

## 2020-01-31 ENCOUNTER — Encounter: Payer: Self-pay | Admitting: Internal Medicine

## 2020-01-31 ENCOUNTER — Other Ambulatory Visit: Payer: Self-pay

## 2020-01-31 VITALS — BP 120/70 | HR 71 | Temp 98.1°F | Ht 67.0 in | Wt 153.0 lb

## 2020-01-31 DIAGNOSIS — M545 Low back pain, unspecified: Secondary | ICD-10-CM

## 2020-01-31 MED ORDER — PREDNISONE 10 MG PO TABS: ORAL_TABLET | ORAL | 0 refills | Status: DC

## 2020-01-31 MED ORDER — CYCLOBENZAPRINE HCL 10 MG PO TABS: 10.0000 mg | ORAL_TABLET | Freq: Three times a day (TID) | ORAL | 0 refills | Status: DC | PRN

## 2020-01-31 NOTE — Progress Notes (Signed)
   Subjective:    Patient ID: Robert Mora, male    DOB: May 29, 1956, 65 y.o.   MRN: [id number]  HPI 64 year old Male who was doing some exercises at the gym bending over and lifting some weights about a month ago.  May have had onset of low back pain then but this has worsened.  Over the weekend he was able to contact Dr. Scarlette Calico for a refill on hydrocodone APAP 5/325. He was seen recently for respiratory infection symptoms treated with Z-Pak and has improved   Review of Systems he has no fever or dysuria     Objective:   Physical Exam  Blood pressure 120/70 pulse 71 pulse oximetry 97% weight 153 pounds temp 98.1 degrees orally.  He ambulates slowly.  Has decreased range of motion with dorsiflexion of his trunk.  Straight leg raising is negative at 90 degrees bilaterally.  Muscle strength is normal in the lower extremities.  Deep tendon reflexes 1+ and symmetrical in the knees.      Assessment & Plan:  Acute low back pain  Plan: Flexeril 1/2 to 1 tablet 3 times a day as needed for muscle spasm and pain.  Continue hydrocodone APAP sparingly.  Take prednisone in tapering course going from 60 mg to 0 mg over 7 days.  If not improving will need to consider physical therapy.  May apply ice or heat to the low back.

## 2020-01-31 NOTE — Telephone Encounter (Signed)
Appointment scheduled.

## 2020-01-31 NOTE — Patient Instructions (Signed)
Flexeril 10 mg up to 3 times a day as needed for musculoskeletal low back pain.  Continue with hydrocodone APAP sparingly for severe pain.  Take prednisone in tapering course as directed.

## 2020-01-31 NOTE — Telephone Encounter (Signed)
See today

## 2020-01-31 NOTE — Telephone Encounter (Addendum)
Robert Mora called to say he is still having a lot of back pain for the last 10 days, and wants to come in today to be seen. No other symptoms.

## 2020-03-21 ENCOUNTER — Telehealth: Payer: Self-pay | Admitting: Internal Medicine

## 2020-03-21 MED ORDER — TADALAFIL 20 MG PO TABS: ORAL_TABLET | ORAL | 11 refills | Status: DC

## 2020-03-21 NOTE — Telephone Encounter (Signed)
Refill  Cialis per request prn one year refills

## 2020-04-13 ENCOUNTER — Other Ambulatory Visit: Payer: Self-pay | Admitting: Internal Medicine

## 2020-04-13 MED ORDER — BUPROPION HCL ER (XL) 300 MG PO TB24: 300.0000 mg | ORAL_TABLET | Freq: Every day | ORAL | 3 refills | Status: DC

## 2020-04-13 NOTE — Telephone Encounter (Signed)
Pt calling because he had to switch pharmacies because of his insurance to [facility] on [address], [place], [place] [address]. And he said they cant transfer his buPROPion (WELLBUTRIN XL) 300 MG 24 hr tablet so he wanted to know if we could send it in since he is runing low

## 2020-04-19 DIAGNOSIS — R972 Elevated prostate specific antigen [PSA]: Secondary | ICD-10-CM | POA: Diagnosis not present

## 2020-04-26 DIAGNOSIS — N402 Nodular prostate without lower urinary tract symptoms: Secondary | ICD-10-CM | POA: Diagnosis not present

## 2020-04-26 DIAGNOSIS — R3121 Asymptomatic microscopic hematuria: Secondary | ICD-10-CM | POA: Diagnosis not present

## 2020-05-11 DIAGNOSIS — Z0120 Encounter for dental examination and cleaning without abnormal findings: Secondary | ICD-10-CM | POA: Diagnosis not present

## 2020-08-07 ENCOUNTER — Other Ambulatory Visit: Payer: Self-pay | Admitting: Internal Medicine

## 2020-08-07 DIAGNOSIS — M545 Low back pain, unspecified: Secondary | ICD-10-CM

## 2020-08-07 MED ORDER — HYDROCODONE-ACETAMINOPHEN 5-325 MG PO TABS: 1.0000 | ORAL_TABLET | Freq: Four times a day (QID) | ORAL | 0 refills | Status: DC | PRN

## 2020-08-28 ENCOUNTER — Encounter: Payer: Self-pay | Admitting: Internal Medicine

## 2020-08-28 ENCOUNTER — Telehealth: Payer: Self-pay | Admitting: Internal Medicine

## 2020-08-28 ENCOUNTER — Other Ambulatory Visit: Payer: Self-pay

## 2020-08-28 ENCOUNTER — Ambulatory Visit (INDEPENDENT_AMBULATORY_CARE_PROVIDER_SITE_OTHER): Payer: Medicare Other | Admitting: Internal Medicine

## 2020-08-28 VITALS — BP 120/60 | HR 67 | Temp 97.7°F | Ht 67.0 in | Wt 146.0 lb

## 2020-08-28 DIAGNOSIS — N529 Male erectile dysfunction, unspecified: Secondary | ICD-10-CM

## 2020-08-28 DIAGNOSIS — H6691 Otitis media, unspecified, right ear: Secondary | ICD-10-CM

## 2020-08-28 MED ORDER — TADALAFIL 20 MG PO TABS: 10.0000 mg | ORAL_TABLET | ORAL | 11 refills | Status: AC | PRN

## 2020-08-28 MED ORDER — DOXYCYCLINE HYCLATE 100 MG PO TABS: 100.0000 mg | ORAL_TABLET | Freq: Two times a day (BID) | ORAL | 0 refills | Status: DC

## 2020-08-28 NOTE — Telephone Encounter (Signed)
Robert Mora [place] [phone]  Robert Mora called to say that since Thursday of last week when he gets up in the morning the right side of his face hurts more in the ear. Little drainage during the day. Would like to come in for you to look at it. COVID-19 vaccine in March, no COVID-19 exposure that he knows of.

## 2020-08-28 NOTE — Telephone Encounter (Signed)
Can see at 4pm

## 2020-08-28 NOTE — Progress Notes (Signed)
   Subjective:    Patient ID: Robert Mora, male    DOB: 03/24/1956, 64 y.o.   MRN: [id number]  HPI 64 year old Male seen today with right otalgia and stuffy nose onset yesterday.  No fever or shaking chills.  No cough.  No sore throat.  No known COVID-19 exposure.  He has had 2 Pfizer vaccines in March and February of this year.  Planning to get booster soon.    Review of Systems see above regarding symptoms     Objective:   Physical Exam Blood pressure 120/80 pulse 67 regular temperature 97.7 pulse oximetry 97% weight 146 pounds.  BMI 22.87.  Height 5 feet 7 inches.  Skin warm and dry.  Pharynx very slightly injected without exudate.  Left TM clear with good light reflex.  Right TM is full and slightly pink.  Neck is supple without adenopathy.  Chest clear to auscultation.       Assessment & Plan:  Acute right otitis media-treat with doxycycline 100 mg twice daily for 10 days.  Rest and drink plenty of fluids.  May take over-the-counter decongestant if needed for ear discomfort.  History of erectile dysfunction-Cialis refilled to drugstore of choice at patient request.

## 2020-08-28 NOTE — Telephone Encounter (Signed)
scheduled

## 2020-08-28 NOTE — Patient Instructions (Signed)
Take doxycycline 100 mg twice daily for 10 days for right otitis media.  Rest and drink plenty of fluids.  May take over-the-counter decongestant if needed.  Cialis was refilled to pharmacy of choice.

## 2020-09-07 ENCOUNTER — Other Ambulatory Visit: Payer: Medicare Other | Admitting: Internal Medicine

## 2020-09-07 ENCOUNTER — Other Ambulatory Visit: Payer: Self-pay

## 2020-09-07 DIAGNOSIS — F17200 Nicotine dependence, unspecified, uncomplicated: Secondary | ICD-10-CM

## 2020-09-07 DIAGNOSIS — Z125 Encounter for screening for malignant neoplasm of prostate: Secondary | ICD-10-CM

## 2020-09-07 DIAGNOSIS — Z0000 Encounter for general adult medical examination without abnormal findings: Secondary | ICD-10-CM | POA: Diagnosis not present

## 2020-09-07 DIAGNOSIS — E7800 Pure hypercholesterolemia, unspecified: Secondary | ICD-10-CM | POA: Diagnosis not present

## 2020-09-07 DIAGNOSIS — F39 Unspecified mood [affective] disorder: Secondary | ICD-10-CM | POA: Diagnosis not present

## 2020-09-07 DIAGNOSIS — J3089 Other allergic rhinitis: Secondary | ICD-10-CM

## 2020-09-07 DIAGNOSIS — N529 Male erectile dysfunction, unspecified: Secondary | ICD-10-CM | POA: Diagnosis not present

## 2020-09-07 DIAGNOSIS — Z86010 Personal history of colonic polyps: Secondary | ICD-10-CM

## 2020-09-08 LAB — CBC WITH DIFFERENTIAL/PLATELET
Absolute Monocytes: 648 cells/uL (ref 200–950)
Basophils Absolute: 16 cells/uL (ref 0–200)
Basophils Relative: 0.2 %
Eosinophils Absolute: 180 cells/uL (ref 15–500)
Eosinophils Relative: 2.2 %
HCT: 38.3 % — ABNORMAL LOW (ref 38.5–50.0)
Hemoglobin: 13.3 g/dL (ref 13.2–17.1)
Lymphs Abs: 1952 cells/uL (ref 850–3900)
MCH: 33.2 pg — ABNORMAL HIGH (ref 27.0–33.0)
MCHC: 34.7 g/dL (ref 32.0–36.0)
MCV: 95.5 fL (ref 80.0–100.0)
MPV: 11.3 fL (ref 7.5–12.5)
Monocytes Relative: 7.9 %
Neutro Abs: 5404 cells/uL (ref 1500–7800)
Neutrophils Relative %: 65.9 %
Platelets: 159 10*3/uL (ref 140–400)
RBC: 4.01 10*6/uL — ABNORMAL LOW (ref 4.20–5.80)
RDW: 12 % (ref 11.0–15.0)
Total Lymphocyte: 23.8 %
WBC: 8.2 10*3/uL (ref 3.8–10.8)

## 2020-09-08 LAB — COMPLETE METABOLIC PANEL WITH GFR
AG Ratio: 1.9 (calc) (ref 1.0–2.5)
ALT: 10 U/L (ref 9–46)
AST: 17 U/L (ref 10–35)
Albumin: 3.7 g/dL (ref 3.6–5.1)
Alkaline phosphatase (APISO): 71 U/L (ref 35–144)
BUN: 12 mg/dL (ref 7–25)
CO2: 26 mmol/L (ref 20–32)
Calcium: 9 mg/dL (ref 8.6–10.3)
Chloride: 109 mmol/L (ref 98–110)
Creat: 1.08 mg/dL (ref 0.70–1.25)
GFR, Est African American: 82 mL/min/{1.73_m2} (ref >=60)
GFR, Est Non African American: 71 mL/min/{1.73_m2} (ref >=60)
Globulin: 2 g/dL (calc) (ref 1.9–3.7)
Glucose, Bld: 94 mg/dL (ref 65–99)
Potassium: 4.2 mmol/L (ref 3.5–5.3)
Sodium: 142 mmol/L (ref 135–146)
Total Bilirubin: 0.5 mg/dL (ref 0.2–1.2)
Total Protein: 5.7 g/dL — ABNORMAL LOW (ref 6.1–8.1)

## 2020-09-08 LAB — LIPID PANEL
Cholesterol: 150 mg/dL (ref <200)
HDL: 53 mg/dL (ref >=40)
LDL Cholesterol (Calc): 83 mg/dL (calc)
Non-HDL Cholesterol (Calc): 97 mg/dL (calc) (ref <130)
Total CHOL/HDL Ratio: 2.8 (calc) (ref <5.0)
Triglycerides: 65 mg/dL (ref <150)

## 2020-09-08 LAB — PSA: PSA: 0.41 ng/mL (ref <=4.0)

## 2020-09-14 ENCOUNTER — Ambulatory Visit (INDEPENDENT_AMBULATORY_CARE_PROVIDER_SITE_OTHER): Payer: Medicare Other | Admitting: Internal Medicine

## 2020-09-14 ENCOUNTER — Other Ambulatory Visit: Payer: Self-pay

## 2020-09-14 VITALS — BP 120/80 | HR 64 | Temp 98.6°F | Ht 67.0 in | Wt 145.0 lb

## 2020-09-14 DIAGNOSIS — F39 Unspecified mood [affective] disorder: Secondary | ICD-10-CM | POA: Diagnosis not present

## 2020-09-14 DIAGNOSIS — Z0000 Encounter for general adult medical examination without abnormal findings: Secondary | ICD-10-CM | POA: Diagnosis not present

## 2020-09-14 DIAGNOSIS — J3089 Other allergic rhinitis: Secondary | ICD-10-CM | POA: Diagnosis not present

## 2020-09-14 DIAGNOSIS — Z86010 Personal history of colonic polyps: Secondary | ICD-10-CM

## 2020-09-14 DIAGNOSIS — R311 Benign essential microscopic hematuria: Secondary | ICD-10-CM

## 2020-09-14 DIAGNOSIS — D692 Other nonthrombocytopenic purpura: Secondary | ICD-10-CM

## 2020-09-14 DIAGNOSIS — E7800 Pure hypercholesterolemia, unspecified: Secondary | ICD-10-CM

## 2020-09-14 DIAGNOSIS — H9041 Sensorineural hearing loss, unilateral, right ear, with unrestricted hearing on the contralateral side: Secondary | ICD-10-CM | POA: Diagnosis not present

## 2020-09-14 DIAGNOSIS — Z23 Encounter for immunization: Secondary | ICD-10-CM | POA: Diagnosis not present

## 2020-09-14 DIAGNOSIS — N529 Male erectile dysfunction, unspecified: Secondary | ICD-10-CM

## 2020-09-14 DIAGNOSIS — F17200 Nicotine dependence, unspecified, uncomplicated: Secondary | ICD-10-CM

## 2020-09-14 LAB — POCT URINALYSIS DIPSTICK
Appearance: NEGATIVE
Bilirubin, UA: NEGATIVE
Glucose, UA: NEGATIVE
Ketones, UA: NEGATIVE
Leukocytes, UA: NEGATIVE
Nitrite, UA: NEGATIVE
Odor: NEGATIVE
Protein, UA: NEGATIVE
Spec Grav, UA: 1.01 (ref 1.010–1.025)
Urobilinogen, UA: 0.2 E.U./dL
pH, UA: 6.5 (ref 5.0–8.0)
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
## 2020-09-28 ENCOUNTER — Ambulatory Visit (INDEPENDENT_AMBULATORY_CARE_PROVIDER_SITE_OTHER): Payer: Medicare Other | Admitting: Internal Medicine

## 2020-09-28 ENCOUNTER — Encounter: Payer: Self-pay | Admitting: Internal Medicine

## 2020-09-28 ENCOUNTER — Other Ambulatory Visit: Payer: Self-pay

## 2020-09-28 VITALS — BP 120/80 | HR 74 | Temp 97.9°F | Ht 67.0 in | Wt 145.0 lb

## 2020-09-28 DIAGNOSIS — Z23 Encounter for immunization: Secondary | ICD-10-CM | POA: Diagnosis not present

## 2020-09-28 NOTE — Patient Instructions (Addendum)
Patient received a Pneumococcal 23 vaccine.

## 2020-10-01 ENCOUNTER — Encounter: Payer: Self-pay | Admitting: Internal Medicine

## 2020-10-01 NOTE — Progress Notes (Signed)
Pneumococcal 23 vaccine given for health care maintenance. MJB, MD

## 2020-10-10 ENCOUNTER — Encounter: Payer: Self-pay | Admitting: Internal Medicine

## 2020-10-10 NOTE — Patient Instructions (Signed)
Colonoscopy due next year.  Immunizations are up-to-date.  Continue current medications.  Follow-up in 1 year or as needed.  It was a pleasure to see you today.

## 2020-11-06 ENCOUNTER — Other Ambulatory Visit: Payer: Self-pay | Admitting: Internal Medicine

## 2020-11-06 DIAGNOSIS — M545 Low back pain, unspecified: Secondary | ICD-10-CM

## 2020-11-06 MED ORDER — HYDROCODONE-ACETAMINOPHEN 5-325 MG PO TABS: 1.0000 | ORAL_TABLET | Freq: Four times a day (QID) | ORAL | 0 refills | Status: AC | PRN

## 2020-12-18 ENCOUNTER — Encounter: Payer: Self-pay | Admitting: Internal Medicine

## 2020-12-18 ENCOUNTER — Telehealth: Payer: Self-pay | Admitting: Internal Medicine

## 2020-12-18 DIAGNOSIS — F5109 Other insomnia not due to a substance or known physiological condition: Secondary | ICD-10-CM

## 2020-12-18 MED ORDER — LORAZEPAM 1 MG PO TABS: ORAL_TABLET | ORAL | 0 refills | Status: DC

## 2020-12-18 NOTE — Telephone Encounter (Signed)
Robert Mora [phone]  Robert Mora called to say he has had insomnia for 7-8- days now.

## 2020-12-18 NOTE — Telephone Encounter (Signed)
Phone call to patient. Partner is snoring and has sleep apnea issues. Patient having trouble sleeping now for several nights.  Call in Ativan 1 mg one hour before bedtime to use as needed and not nightly. #30 with no refill sent to [facility].

## 2021-03-26 ENCOUNTER — Telehealth: Payer: Self-pay | Admitting: Internal Medicine

## 2021-03-26 NOTE — Telephone Encounter (Signed)
Needs appt

## 2021-03-26 NOTE — Telephone Encounter (Signed)
Pt called and wanted to know if he can get in sometime either this week or next week to discuss buPROPion (WELLBUTRIN XL) 300 MG 24 hr tablet to see if its still as effective as it needs to be, He doesn't think it has been as effective for the past couple weeks.

## 2021-03-26 NOTE — Telephone Encounter (Signed)
Scheduled

## 2021-03-29 ENCOUNTER — Encounter: Payer: Self-pay | Admitting: Internal Medicine

## 2021-03-29 ENCOUNTER — Other Ambulatory Visit: Payer: Self-pay

## 2021-03-29 ENCOUNTER — Ambulatory Visit (INDEPENDENT_AMBULATORY_CARE_PROVIDER_SITE_OTHER): Payer: Medicare Other | Admitting: Internal Medicine

## 2021-03-29 VITALS — BP 110/70 | HR 73 | Ht 67.0 in | Wt 148.0 lb

## 2021-03-29 DIAGNOSIS — F320 Major depressive disorder, single episode, mild: Secondary | ICD-10-CM | POA: Diagnosis not present

## 2021-03-29 DIAGNOSIS — F439 Reaction to severe stress, unspecified: Secondary | ICD-10-CM | POA: Diagnosis not present

## 2021-03-29 DIAGNOSIS — F5109 Other insomnia not due to a substance or known physiological condition: Secondary | ICD-10-CM | POA: Diagnosis not present

## 2021-03-29 DIAGNOSIS — M5431 Sciatica, right side: Secondary | ICD-10-CM | POA: Diagnosis not present

## 2021-03-29 MED ORDER — HYDROCODONE-ACETAMINOPHEN 10-325 MG PO TABS
1.0000 | ORAL_TABLET | Freq: Three times a day (TID) | ORAL | 0 refills | Status: DC | PRN
Start: 2021-03-29 — End: 2021-10-30

## 2021-03-29 MED ORDER — SERTRALINE HCL 50 MG PO TABS: 50.0000 mg | ORAL_TABLET | Freq: Every day | ORAL | 0 refills | Status: DC

## 2021-03-29 NOTE — Progress Notes (Signed)
   Subjective:    Patient ID: Robert Mora, male    DOB: [birth year], 65 y.o.   MRN: [id number]  HPI 65 year old Male in today to discuss situational stress, anxiety and depression.  He has been on Wellbutrin for a considerable amount of time.  Lately, situation with his elderly parents who live in [place] has been stressful.  Patient is considering moving to [facility] but situation with parents is worrisome to him.  Neither are in very good health.  It makes him anxious when he receives a phone call from them.  Someone suggested Concerta to him.  This is for attention deficit issues and although it might would give him some energy I do not think he has attention deficit disorder.  I think a better choice would be an SSRI which would increase serotonin.  Explained to him that Wellbutrin increases dopamine.  He is agreeable to trying Zoloft which is nonsedating and should not cause weight gain.  Were going to start with 50 mg daily and he will let me know in 4 to 6 weeks how things are going with that.  He does have prescription for Ativan to take at bedtime if needed for insomnia due to situational stress.  He also has lumbar back pain with right-sided and request a small refill on hydrocodone APAP 10/325.  Explained we can only give 5 days of narcotics due to regulations.  I given him # 15 tablets to take sparingly.   He has had 3 COVID immunizations and should get booster.  Other immunizations are up-to-date.  Should consider Shingrix vaccine if he has not had that.  He has lorazepam 1 mg to take 1 hour prior to bedtime as needed which was started in February  Review of Systems see above     Objective:   Physical Exam  Blood pressure 110/70 pulse 73 pulse oximetry 97% weight 148 pounds height 5 feet 7 inches BMI 23.18  Affect is  a bit flat due to stress which was discussed in detail today.      Assessment & Plan:  Situational stress with parents  Recurrent right-sided  back pain treated sparingly with Norco 10/325  Mild depression  Plan: He will continue with Wellbutrin previously prescribed and start Zoloft 50 mg daily.  Follow-up in 4 weeks. Counseling recommended.  COVID booster recommended.

## 2021-03-29 NOTE — Patient Instructions (Addendum)
Add Zoloft 50 mg daily to Wellbutrin.  Follow-up and 4 weeks and let me know how you are doing.  Continue lorazepam for insomnia if needed.  Recommend COVID booster.

## 2021-04-25 DIAGNOSIS — R972 Elevated prostate specific antigen [PSA]: Secondary | ICD-10-CM | POA: Diagnosis not present

## 2021-05-01 ENCOUNTER — Encounter: Payer: Self-pay | Admitting: Internal Medicine

## 2021-05-01 ENCOUNTER — Other Ambulatory Visit: Payer: Self-pay

## 2021-05-01 ENCOUNTER — Ambulatory Visit (INDEPENDENT_AMBULATORY_CARE_PROVIDER_SITE_OTHER): Payer: Medicare Other | Admitting: Internal Medicine

## 2021-05-01 VITALS — BP 120/70 | HR 80 | Ht 67.0 in | Wt 147.0 lb

## 2021-05-01 DIAGNOSIS — F439 Reaction to severe stress, unspecified: Secondary | ICD-10-CM | POA: Diagnosis not present

## 2021-05-01 DIAGNOSIS — F5109 Other insomnia not due to a substance or known physiological condition: Secondary | ICD-10-CM | POA: Diagnosis not present

## 2021-05-01 DIAGNOSIS — F320 Major depressive disorder, single episode, mild: Secondary | ICD-10-CM | POA: Diagnosis not present

## 2021-05-01 MED ORDER — SERTRALINE HCL 50 MG PO TABS
50.0000 mg | ORAL_TABLET | Freq: Every day | ORAL | 3 refills | Status: DC
Start: 2021-05-01 — End: 2021-09-17

## 2021-05-01 NOTE — Progress Notes (Signed)
   Subjective:    Patient ID: TRICE ASPINALL, male    DOB: 12/21/1955, 65 y.o.   MRN: [id number]  HPI 65 year old Male for one month follow up.  In May he was started on Zoloft 50 mg daily for situational stress, anxiety and depression.  Has been on Wellbutrin XL 300 mg daily for a number of years but did not feel this was helping. Does have Ativan on hand to take at bedtime as needed for insomnia due to situational stress.  Has small quantity of hydrocodone APAP 10/325 for lumbar back pain.  Worried about his parents who are elderly.  He tries to visit as often as he can.  They remain in their own home out of town.  He checks on them frequently.   Review of Systems see above     Objective:   Physical Exam  Affect thought and judgment appear to be normal. Blood pressure 120/70, pulse 80 regular pulse oximetry 97%, weight 147 pounds BMI 23.02     Assessment & Plan:   Situational depression-currently on Wellbutrin and Zoloft.  Patient seems to feel better with selective serotonin reuptake inhibitor medication.  We will continue both of these medications.

## 2021-05-03 DIAGNOSIS — N402 Nodular prostate without lower urinary tract symptoms: Secondary | ICD-10-CM | POA: Diagnosis not present

## 2021-05-03 DIAGNOSIS — R3121 Asymptomatic microscopic hematuria: Secondary | ICD-10-CM | POA: Diagnosis not present

## 2021-05-23 ENCOUNTER — Other Ambulatory Visit: Payer: Self-pay | Admitting: Internal Medicine

## 2021-06-02 NOTE — Patient Instructions (Signed)
It was a pleasure to see you today.  We are glad you are feeling better.  Continue both Wellbutrin and Zoloft.  Follow-up in November for Medicare wellness visit and health maintenance exam.

## 2021-07-03 ENCOUNTER — Encounter: Payer: Self-pay | Admitting: Internal Medicine

## 2021-08-07 ENCOUNTER — Telehealth: Payer: Self-pay | Admitting: Internal Medicine

## 2021-08-07 ENCOUNTER — Other Ambulatory Visit: Payer: Self-pay

## 2021-08-07 ENCOUNTER — Encounter: Payer: Self-pay | Admitting: Internal Medicine

## 2021-08-07 ENCOUNTER — Ambulatory Visit (INDEPENDENT_AMBULATORY_CARE_PROVIDER_SITE_OTHER): Payer: Medicare Other | Admitting: Internal Medicine

## 2021-08-07 VITALS — BP 128/74 | HR 70 | Temp 98.7°F | Resp 16 | Ht 67.0 in | Wt 148.0 lb

## 2021-08-07 DIAGNOSIS — M7918 Myalgia, other site: Secondary | ICD-10-CM

## 2021-08-07 DIAGNOSIS — J22 Unspecified acute lower respiratory infection: Secondary | ICD-10-CM

## 2021-08-07 MED ORDER — HYDROCODONE-ACETAMINOPHEN 5-325 MG PO TABS: 1.0000 | ORAL_TABLET | Freq: Three times a day (TID) | ORAL | 0 refills | Status: DC | PRN

## 2021-08-07 MED ORDER — HYDROCODONE BIT-HOMATROP MBR 5-1.5 MG/5ML PO SOLN: 5.0000 mL | Freq: Three times a day (TID) | ORAL | 0 refills | Status: DC | PRN

## 2021-08-07 MED ORDER — DOXYCYCLINE HYCLATE 100 MG PO TABS: 100.0000 mg | ORAL_TABLET | Freq: Two times a day (BID) | ORAL | 0 refills | Status: DC

## 2021-08-07 NOTE — Patient Instructions (Addendum)
Take doxycycline 100 mg twice daily for 10 days.  May take Hycodan 1 teaspoon every 8 hours as needed for cough.  For musculoskeletal pain particularly with travel, take Hydrocodone APAP 5/325 sparingly every 8 hours if needed.    Rest and drink fluids.

## 2021-08-07 NOTE — Progress Notes (Signed)
   Subjective:    Patient ID: Robert Mora, male    DOB: [birth year], 65 y.o.   MRN: [id number]  HPI 65 year old Male seen for  cough and congestion that oddly started after recent vaccine for Monkey Pox.  Patient is planning to travel to [facility] beginning October 5.  He would like to have hydrocodone APAP refilled for musculoskeletal pain.  He has been coughing quite a bit.  No known COVID-19 exposure.  He tested  for COVID-19 before coming to this office and says result was negative.  Initially when he had onset of symptoms, he had discolored sputum.  Sputum is not discolored at the present time but he still has some sputum  white production when he coughs.  He denies fever or chills.  No sore throat.  He has had 3 COVID immunizations by our records.  Review of Systems no headache, dysgeusia, nausea, vomiting, diarrhea     Objective:   Physical Exam BP 128/74 ,pulse 70, RR 16, T 98.7, pulse ox 97% on room air.  Skin warm and dry.  Neck is supple.  No adenopathy in the cervical area.  TMs are slightly full bilaterally.  Pharynx clear. Chest is clear to auscultation without rales or wheezing.      Assessment & Plan:  Acute lower respiratory infection  Recently vaccinated for Monkey Pox  Musculoskeletal pain  History of musculoskeletal pain treated sparingly with Hydrocodone APAP and planning to travel to [place] next week.  Patient would like refill.  Prescribed hydrocodone APAP 5/325 (number 15 tablets) to take sparingly every 8 hours as needed for musculoskeletal pain.   For respiratory infection, prescribed doxycycline 100 mg twice daily for 10 days. Hycodan 1 teaspoon every 8 hours as needed for cough.  Rest and drink fluids.

## 2021-08-07 NOTE — Telephone Encounter (Signed)
Robert Mora [phone]  [name] called to say he has had a cough for over a week and drainage in his throat. Cough is now dry raspy. He did a COVID test last Sunday and it was negative. Has had 3 COVID shots. I have ask him to do another COVID test and to call me back with results. No fever.

## 2021-08-07 NOTE — Telephone Encounter (Signed)
[name] called back and his recent COVID test is negative.

## 2021-08-07 NOTE — Telephone Encounter (Signed)
scheduled

## 2021-09-04 ENCOUNTER — Other Ambulatory Visit (HOSPITAL_COMMUNITY): Payer: Self-pay

## 2021-09-05 ENCOUNTER — Encounter: Payer: Self-pay | Admitting: Pharmacist

## 2021-09-05 ENCOUNTER — Other Ambulatory Visit (HOSPITAL_COMMUNITY): Payer: Self-pay

## 2021-09-05 ENCOUNTER — Telehealth: Payer: Self-pay

## 2021-09-05 NOTE — Telephone Encounter (Signed)
[facility] Patient Advocate Encounter  Prior Authorization for Apretude has been approved.     Effective dates: 09/04/21 through 09/04/22  Patients co-pay is $285.00.   I can enroll the patient in PAF or [employer] to help with the copay.   Prescription can be filled at [facility].  [facility] will continue to follow.  [name], [facility] Patient [facility] for Infectious Disease Phone: [phone] Fax:  [phone]

## 2021-09-06 ENCOUNTER — Other Ambulatory Visit: Payer: Self-pay | Admitting: Pharmacist

## 2021-09-06 DIAGNOSIS — Z113 Encounter for screening for infections with a predominantly sexual mode of transmission: Secondary | ICD-10-CM

## 2021-09-06 DIAGNOSIS — Z79899 Other long term (current) drug therapy: Secondary | ICD-10-CM

## 2021-09-11 ENCOUNTER — Telehealth: Payer: Self-pay

## 2021-09-11 ENCOUNTER — Other Ambulatory Visit: Payer: Self-pay | Admitting: Pharmacist

## 2021-09-11 ENCOUNTER — Other Ambulatory Visit (HOSPITAL_COMMUNITY): Payer: Self-pay

## 2021-09-11 DIAGNOSIS — Z79899 Other long term (current) drug therapy: Secondary | ICD-10-CM

## 2021-09-11 MED ORDER — APRETUDE 600 MG/3ML IM SUER
600.0000 mg | INTRAMUSCULAR | 5 refills | Status: DC
  Filled 2021-09-11: qty 3, 60d supply, fill #0

## 2021-09-11 MED ORDER — APRETUDE 600 MG/3ML IM SUER
600.0000 mg | INTRAMUSCULAR | 1 refills | Status: DC
  Filled 2021-09-11 (×2): qty 3, 30d supply, fill #0

## 2021-09-11 NOTE — Telephone Encounter (Signed)
[facility] Patient Advocate Encounter   I was successful in securing patient a $ 7500.00 grant from [employer] to provide copayment coverage for Apretude.  The patient's out of pocket cost will be 0.00 monthly.     I have spoken with the patient.    The billing information is as follows and has been shared with [facility].         Dates of Eligibility: 09/11/21 through 11/10/21  Patient knows to call the office with questions or concerns.  [name], [facility] Patient [facility] for Infectious Disease Phone: [phone] Fax:  [phone]

## 2021-09-12 ENCOUNTER — Other Ambulatory Visit (HOSPITAL_COMMUNITY): Payer: Self-pay

## 2021-09-12 ENCOUNTER — Telehealth: Payer: Self-pay

## 2021-09-12 NOTE — Telephone Encounter (Signed)
[facility] Patient Advocate Encounter  Patient's medication(Cabenuva) have been couriered to [facility] from [place] and will be administered on patient next office visit on 09/13/21.  [name] , [facility] Patient [facility] for Infectious Disease Phone: [phone] Fax:  [phone]

## 2021-09-13 ENCOUNTER — Ambulatory Visit (INDEPENDENT_AMBULATORY_CARE_PROVIDER_SITE_OTHER): Payer: Medicare Other | Admitting: Pharmacist

## 2021-09-13 ENCOUNTER — Other Ambulatory Visit: Payer: Self-pay

## 2021-09-13 ENCOUNTER — Other Ambulatory Visit (HOSPITAL_COMMUNITY)
Admission: RE | Admit: 2021-09-13 | Discharge: 2021-09-13 | Disposition: A | Discharge status: Home / Self Care | Payer: Medicare Other | Source: Ambulatory Visit | Attending: Infectious Disease | Admitting: Infectious Disease

## 2021-09-13 ENCOUNTER — Other Ambulatory Visit: Payer: Medicare Other

## 2021-09-13 DIAGNOSIS — Z79899 Other long term (current) drug therapy: Secondary | ICD-10-CM | POA: Diagnosis not present

## 2021-09-13 DIAGNOSIS — Z113 Encounter for screening for infections with a predominantly sexual mode of transmission: Secondary | ICD-10-CM | POA: Insufficient documentation

## 2021-09-13 MED ORDER — CABOTEGRAVIR ER 600 MG/3ML IM SUER
600.0000 mg | Freq: Once | INTRAMUSCULAR | Status: AC
Start: 2021-09-13 — End: 2021-09-13
  Administered 2021-09-13: 600 mg via INTRAMUSCULAR

## 2021-09-13 NOTE — Progress Notes (Signed)
HPI: Robert Mora is a 65 y.o. male who presents to the [place] clinic for Apretude administration and HIV PrEP follow up.  Patient Active Problem List   Diagnosis Date Noted   Musculoskeletal pain 08/07/2021   Acute bilateral low back pain without sciatica 01/30/2020   Benign microscopic hematuria 08/31/2019   Exposure to chlamydia 04/09/2017   Hyperlipemia 12/27/2011   Mood disorder ([place]) 08/11/2011   Hx of adenomatous colonic polyps 08/11/2011   Onychomycosis 08/11/2011   History of hepatitis B 08/11/2011    Patient's Medications  New Prescriptions   No medications on file  Previous Medications   BUPROPION (WELLBUTRIN XL) 300 MG 24 HR TABLET    TAKE 1 TABLET ONCE DAILY.   CABOTEGRAVIR ER (APRETUDE) 600 MG/3ML INJECTION    Inject 3 mLs (600 mg total) into the muscle every 30 (thirty) days.   CABOTEGRAVIR ER (APRETUDE) 600 MG/3ML INJECTION    Inject 3 mLs (600 mg total) into the muscle every 2 (two) months.   DOXYCYCLINE (VIBRA-TABS) 100 MG TABLET    Take 1 tablet (100 mg total) by mouth 2 (two) times daily.   HYDROCODONE BIT-HOMATROPINE (HYCODAN) 5-1.5 MG/5ML SYRUP    Take 5 mLs by mouth every 8 (eight) hours as needed for cough.   HYDROCODONE-ACETAMINOPHEN (NORCO) 5-325 MG TABLET    Take 1 tablet by mouth 3 (three) times daily with meals as needed for moderate pain.   ROSUVASTATIN (CRESTOR) 10 MG TABLET    Take 10 mg by mouth daily.   SERTRALINE (ZOLOFT) 50 MG TABLET    Take 1 tablet (50 mg total) by mouth daily.   TADALAFIL (CIALIS) 20 MG TABLET    Take 0.5-1 tablets (10-20 mg total) by mouth every other day as needed for erectile dysfunction.  Modified Medications   No medications on file  Discontinued Medications   No medications on file    Allergies: Allergies  Allergen Reactions   Celebrex [Celecoxib]    Terbinafine Hcl    Vioxx [Rofecoxib]     Past Medical History: Past Medical History:  Diagnosis Date   Adenomatous colon polyp    Heart murmur     as a child   Hx of adenomatous colonic polyps 08/11/2011   Insomnia     Social History: Social History   Socioeconomic History   Marital status: Single    Spouse name: Not on file   Number of children: Not on file   Years of education: Not on file   Highest education level: Not on file  Occupational History   Not on file  Tobacco Use   Smoking status: Some Days    Packs/day: 0.50    Years: 30.00    Pack years: 15.00    Types: Cigarettes    Last attempt to quit: 04/08/2009    Years since quitting: 12.4   Smokeless tobacco: Never  Substance and Sexual Activity   Alcohol use: Yes    Alcohol/week: 0.0 standard drinks    Comment: rarely/ beer   Drug use: No   Sexual activity: Not on file  Other Topics Concern   Not on file  Social History Narrative   Not on file   Social Determinants of Health   Financial Resource Strain: Not on file  Food Insecurity: Not on file  Transportation Needs: Not on file  Physical Activity: Not on file  Stress: Not on file  Social Connections: Not on file    Labs: No results found for: HIV1RNAQUANT,  HIV1RNAVL, CD4TABS  RPR and STI Lab Results  Component Value Date   LABRPR Non Reactive 06/14/2019   LABRPR NON-REACTIVE 12/10/2018    STI Results GC CT  03/20/2017 - NOT DETECTED  03/05/2016 NOT DETECTED NOT DETECTED    Hepatitis B No results found for: HEPBSAB, HEPBSAG, HEPBCAB Hepatitis C No results found for: HEPCAB, HCVRNAPCRQN Hepatitis A No results found for: HAV Lipids: Lab Results  Component Value Date   CHOL 150 09/07/2020   TRIG 65 09/07/2020   HDL 53 09/07/2020   CHOLHDL 2.8 09/07/2020   VLDL 24 12/27/2016   LDLCALC 83 09/07/2020    Current PrEP Regimen: None  TARGET DATE: The 3rd of the month  Assessment: Robert Mora presents today to start HIV PrEP treatment and receive their first initiation injection of Apretude. He is insured through [employer] and will receive his Apretude from [facility].  He  is a MSM who has one sexual partner, who I also see for HIV PrEP. They do not use condoms.  He is screened for HIV annually through his primary care doctor but has not been screened for STIs in quite awhile. He was diagnosed with anal warts around 4 years ago. He is mainly the receptive partner during sexual intercourse and also engages in oral sex as well. He has never used PEP or IV drugs. He states that he has had Hepatitis B in the past and has stayed away from any oral medication for PrEP for that reason. H will start Apretude today.  Counseled that Apretude is one intramuscular injection in the gluteal muscle for each visit. Explained that the second injection is 30 days after the initial injection then every 2 months thereafter. Discussed follow up appointments moving forward. It is required to have a negative HIV test immediately prior to injection administration. A rapid HIV blood test will be drawn each visit, and patient must wait for results before getting injection, which typically takes 20-30 minutes. Will make lab appointments for each injection 30 minutes prior to injection appointment. Screened patient for acute HIV symptoms such as fatigue, muscle aches, rash, sore throat, lymphadenopathy, headache, night sweats, nausea/vomiting/diarrhea, and fever. Patient denies any symptoms.   Explained that showing up to injection appointments is very important and warned that if appointments are missed, protection will be minimal and the risk of acquiring HIV becomes much higher. Counseled on possible side effects associated with the injections such as injection site pain, which is usually mild to moderate in nature, injection site nodules, and injection site reactions. Asked to call the clinic or send me a mychart message if they experience any issues. Advised that they can take Motrin or Tylenol for injection site pain if needed. They may also pre-treat with Motrin or Tylenol 30-45 minutes before  scheduled appointments.   Rapid HIV antibody blood test was drawn immediately prior to injection and was negative. Patient also had HIV RNA drawn today. Administered cabotegravir 600mg /72mL in right upper outer quadrant of the gluteal muscle. Monitored patient for 10 minutes after injection. Injection was tolerated well without issue. Counseled to call with any issues that may arise. Also counseled that condoms are still advised and encouraged. Will make follow up appointments for second initiation injection in 30 days and then maintenance injections every 2 months thereafter.   Plan: - HIV RNA viral load, RPR, urine/rectal/pharyngeal GC/CT swabs for cytology, hepatitis B surface antigen, hepatitis B surface antibody, hepatitis A antibody, hepatitis C antibody today - First Apretude injection  administered - Second initiation injection scheduled for 12/1 at 3pm - Call with any issues or questions  Jeani Fassnacht L. Kinberly Perris, PharmD, BCIDP, AAHIVP, [place] Clinical Pharmacist Practitioner [place] [facility]

## 2021-09-13 NOTE — Patient Instructions (Addendum)
It was great seeing you today!   You received the long-acting injectable, Apretude, today. Injection site reactions are common with the first few injections. They are generally mild to moderate in nature and only last a few days. For the first 2-3 injections, take an OTC pain medication, such as Motrin or Tylenol, within a couple hours before your injection and continue as needed for a few days. You may also apply a warm compress or heating pad to the injection site for 15-20 minutes after the injection. Please try not to rub the injection site as this can disrupt the medication pocket in your muscle.   Other side effect such as a nodule at the site of injection, pain, tenderness, swelling, or bruising can happen but are rare. Please let us know if you have any issues.  We are not sure how long it takes for Apretude to get protective levels in your body to prevent HIV infection. Please use condoms for the first 2 weeks after receiving your first injection. Apretude lasts a long time in your body but does not keep protective levels in your body for more than a few months. Please make all of your scheduled appointments and let us know ASAP if you have to miss an injection.   Please send [place] a MyChart message if you have any questions. Have a great day!   [id number].

## 2021-09-14 ENCOUNTER — Other Ambulatory Visit: Payer: Medicare Other | Admitting: Internal Medicine

## 2021-09-14 DIAGNOSIS — Z0000 Encounter for general adult medical examination without abnormal findings: Secondary | ICD-10-CM | POA: Diagnosis not present

## 2021-09-14 DIAGNOSIS — Z125 Encounter for screening for malignant neoplasm of prostate: Secondary | ICD-10-CM | POA: Diagnosis not present

## 2021-09-14 DIAGNOSIS — F320 Major depressive disorder, single episode, mild: Secondary | ICD-10-CM | POA: Diagnosis not present

## 2021-09-14 DIAGNOSIS — E7800 Pure hypercholesterolemia, unspecified: Secondary | ICD-10-CM | POA: Diagnosis not present

## 2021-09-14 LAB — URINE CYTOLOGY ANCILLARY ONLY
Chlamydia: NEGATIVE
Comment: NEGATIVE
Comment: NORMAL
Neisseria Gonorrhea: NEGATIVE

## 2021-09-15 LAB — COMPLETE METABOLIC PANEL WITH GFR
AG Ratio: 1.7 (calc) (ref 1.0–2.5)
ALT: 17 U/L (ref 9–46)
AST: 25 U/L (ref 10–35)
Albumin: 4 g/dL (ref 3.6–5.1)
Alkaline phosphatase (APISO): 87 U/L (ref 35–144)
BUN: 14 mg/dL (ref 7–25)
CO2: 31 mmol/L (ref 20–32)
Calcium: 9 mg/dL (ref 8.6–10.3)
Chloride: 106 mmol/L (ref 98–110)
Creat: 0.98 mg/dL (ref 0.70–1.35)
Globulin: 2.3 g/dL (calc) (ref 1.9–3.7)
Glucose, Bld: 84 mg/dL (ref 65–99)
Potassium: 4.5 mmol/L (ref 3.5–5.3)
Sodium: 141 mmol/L (ref 135–146)
Total Bilirubin: 0.4 mg/dL (ref 0.2–1.2)
Total Protein: 6.3 g/dL (ref 6.1–8.1)
eGFR: 85 mL/min/{1.73_m2} (ref >=60)

## 2021-09-15 LAB — CBC WITH DIFFERENTIAL/PLATELET
Absolute Monocytes: 641 cells/uL (ref 200–950)
Basophils Absolute: 45 cells/uL (ref 0–200)
Basophils Relative: 0.5 %
Eosinophils Absolute: 240 cells/uL (ref 15–500)
Eosinophils Relative: 2.7 %
HCT: 40.7 % (ref 38.5–50.0)
Hemoglobin: 13.9 g/dL (ref 13.2–17.1)
Lymphs Abs: 2990 cells/uL (ref 850–3900)
MCH: 32 pg (ref 27.0–33.0)
MCHC: 34.2 g/dL (ref 32.0–36.0)
MCV: 93.8 fL (ref 80.0–100.0)
MPV: 10.5 fL (ref 7.5–12.5)
Monocytes Relative: 7.2 %
Neutro Abs: 4984 cells/uL (ref 1500–7800)
Neutrophils Relative %: 56 %
Platelets: 215 10*3/uL (ref 140–400)
RBC: 4.34 10*6/uL (ref 4.20–5.80)
RDW: 11.9 % (ref 11.0–15.0)
Total Lymphocyte: 33.6 %
WBC: 8.9 10*3/uL (ref 3.8–10.8)

## 2021-09-15 LAB — RPR: RPR Ser Ql: NONREACTIVE

## 2021-09-15 LAB — LIPID PANEL
Cholesterol: 183 mg/dL (ref <200)
HDL: 47 mg/dL (ref >=40)
LDL Cholesterol (Calc): 114 mg/dL (calc) — ABNORMAL HIGH
Non-HDL Cholesterol (Calc): 136 mg/dL (calc) — ABNORMAL HIGH (ref <130)
Total CHOL/HDL Ratio: 3.9 (calc) (ref <5.0)
Triglycerides: 113 mg/dL (ref <150)

## 2021-09-15 LAB — PSA: PSA: 0.34 ng/mL (ref <=4.00)

## 2021-09-15 LAB — HEPATITIS B SURFACE ANTIBODY,QUALITATIVE: Hep B S Ab: REACTIVE — AB

## 2021-09-15 LAB — HEPATITIS B SURFACE ANTIGEN: Hepatitis B Surface Ag: NONREACTIVE

## 2021-09-15 LAB — HEPATITIS C ANTIBODY
Hepatitis C Ab: NONREACTIVE
SIGNAL TO CUT-OFF: 0.07 (ref <1.00)

## 2021-09-15 LAB — HEPATITIS A ANTIBODY, TOTAL: Hepatitis A AB,Total: NONREACTIVE

## 2021-09-15 LAB — HIV-1 RNA QUANT-NO REFLEX-BLD
HIV 1 RNA Quant: NOT DETECTED Copies/mL
HIV-1 RNA Quant, Log: NOT DETECTED Log cps/mL

## 2021-09-17 ENCOUNTER — Ambulatory Visit (INDEPENDENT_AMBULATORY_CARE_PROVIDER_SITE_OTHER): Payer: Medicare Other | Admitting: Internal Medicine

## 2021-09-17 ENCOUNTER — Encounter: Payer: Self-pay | Admitting: Internal Medicine

## 2021-09-17 ENCOUNTER — Other Ambulatory Visit: Payer: Self-pay

## 2021-09-17 VITALS — BP 102/60 | HR 76 | Temp 98.6°F | Resp 16 | Ht 67.25 in | Wt 146.0 lb

## 2021-09-17 DIAGNOSIS — F39 Unspecified mood [affective] disorder: Secondary | ICD-10-CM

## 2021-09-17 DIAGNOSIS — Z86010 Personal history of colonic polyps: Secondary | ICD-10-CM

## 2021-09-17 DIAGNOSIS — Z0000 Encounter for general adult medical examination without abnormal findings: Secondary | ICD-10-CM

## 2021-09-17 DIAGNOSIS — J22 Unspecified acute lower respiratory infection: Secondary | ICD-10-CM

## 2021-09-17 DIAGNOSIS — E7800 Pure hypercholesterolemia, unspecified: Secondary | ICD-10-CM

## 2021-09-17 DIAGNOSIS — H9041 Sensorineural hearing loss, unilateral, right ear, with unrestricted hearing on the contralateral side: Secondary | ICD-10-CM

## 2021-09-17 DIAGNOSIS — N529 Male erectile dysfunction, unspecified: Secondary | ICD-10-CM

## 2021-09-17 DIAGNOSIS — D692 Other nonthrombocytopenic purpura: Secondary | ICD-10-CM

## 2021-09-17 DIAGNOSIS — J3089 Other allergic rhinitis: Secondary | ICD-10-CM

## 2021-09-17 DIAGNOSIS — R311 Benign essential microscopic hematuria: Secondary | ICD-10-CM

## 2021-09-17 DIAGNOSIS — F17200 Nicotine dependence, unspecified, uncomplicated: Secondary | ICD-10-CM

## 2021-09-17 LAB — POCT URINALYSIS DIPSTICK
Bilirubin, UA: NEGATIVE
Glucose, UA: NEGATIVE
Ketones, UA: NEGATIVE
Leukocytes, UA: NEGATIVE
Nitrite, UA: NEGATIVE
Protein, UA: NEGATIVE
Spec Grav, UA: 1.015 (ref 1.010–1.025)
Urobilinogen, UA: 0.2 E.U./dL
pH, UA: 5 (ref 5.0–8.0)

## 2021-09-17 LAB — CYTOLOGY, (ORAL, ANAL, URETHRAL) ANCILLARY ONLY
Chlamydia: NEGATIVE
Chlamydia: NEGATIVE
Comment: NEGATIVE
Comment: NEGATIVE
Comment: NORMAL
Comment: NORMAL
Neisseria Gonorrhea: NEGATIVE
Neisseria Gonorrhea: NEGATIVE

## 2021-09-17 MED ORDER — PREDNISONE 10 MG PO TABS: ORAL_TABLET | ORAL | 0 refills | Status: DC

## 2021-09-17 MED ORDER — DOXYCYCLINE HYCLATE 100 MG PO TABS: 100.0000 mg | ORAL_TABLET | Freq: Two times a day (BID) | ORAL | 1 refills | Status: DC

## 2021-09-17 MED ORDER — HYDROCODONE BIT-HOMATROP MBR 5-1.5 MG/5ML PO SOLN: 5.0000 mL | Freq: Three times a day (TID) | ORAL | 0 refills | Status: DC | PRN

## 2021-09-17 NOTE — Patient Instructions (Addendum)
Hycodan one tsp every 8 hours as needed for cough. Doxycycline 100 mg twice a day x 10 days.  Continue other medications as previously prescribed.  Return in 6 months for follow-up on lipids.
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
## 2021-10-03 ENCOUNTER — Other Ambulatory Visit: Payer: Self-pay | Admitting: Pharmacist

## 2021-10-03 ENCOUNTER — Other Ambulatory Visit (HOSPITAL_COMMUNITY): Payer: Self-pay

## 2021-10-03 DIAGNOSIS — B20 Human immunodeficiency virus [HIV] disease: Secondary | ICD-10-CM

## 2021-10-03 DIAGNOSIS — Z79899 Other long term (current) drug therapy: Secondary | ICD-10-CM

## 2021-10-03 MED ORDER — APRETUDE 600 MG/3ML IM SUER
600.0000 mg | INTRAMUSCULAR | 5 refills | Status: DC
  Filled 2021-10-03 – 2021-11-26 (×2): qty 3, 60d supply, fill #0
  Filled 2022-01-21: qty 3, 60d supply, fill #1
  Filled 2022-03-29: qty 3, 60d supply, fill #2
  Filled 2022-05-27: qty 3, 60d supply, fill #3
  Filled 2022-08-02: qty 3, 60d supply, fill #4

## 2021-10-03 MED ORDER — APRETUDE 600 MG/3ML IM SUER
600.0000 mg | INTRAMUSCULAR | 0 refills | Status: DC
  Filled 2021-10-03 (×2): qty 3, 30d supply, fill #0

## 2021-10-05 ENCOUNTER — Other Ambulatory Visit (HOSPITAL_COMMUNITY): Payer: Self-pay

## 2021-10-08 ENCOUNTER — Telehealth: Payer: Self-pay

## 2021-10-08 NOTE — Telephone Encounter (Signed)
[facility] Patient Advocate Encounter  Patient's medication (APRETUDE)have been couriered to [facility] from [facility] and will be administered on patient next office visit on 10/11/21.  Patient supplied  Robert Mora , [place] Patient [facility] Phone: [phone] Fax:  [phone]

## 2021-10-11 ENCOUNTER — Other Ambulatory Visit: Payer: Medicare Other

## 2021-10-11 ENCOUNTER — Ambulatory Visit (INDEPENDENT_AMBULATORY_CARE_PROVIDER_SITE_OTHER): Payer: Medicare Other | Admitting: Pharmacist

## 2021-10-11 ENCOUNTER — Other Ambulatory Visit: Payer: Self-pay | Admitting: Pharmacist

## 2021-10-11 ENCOUNTER — Other Ambulatory Visit: Payer: Self-pay

## 2021-10-11 DIAGNOSIS — B20 Human immunodeficiency virus [HIV] disease: Secondary | ICD-10-CM | POA: Diagnosis not present

## 2021-10-11 DIAGNOSIS — Z23 Encounter for immunization: Secondary | ICD-10-CM

## 2021-10-11 DIAGNOSIS — Z79899 Other long term (current) drug therapy: Secondary | ICD-10-CM

## 2021-10-11 MED ORDER — CABOTEGRAVIR ER 600 MG/3ML IM SUER
600.0000 mg | Freq: Once | INTRAMUSCULAR | Status: AC
  Administered 2021-10-11: 600 mg via INTRAMUSCULAR

## 2021-10-11 NOTE — Progress Notes (Signed)
HPI: Robert Mora is a 65 y.o. male who presents to the [place] clinic for Apretude administration and HIV PrEP follow up.  Patient Active Problem List   Diagnosis Date Noted   Musculoskeletal pain 08/07/2021   Acute bilateral low back pain without sciatica 01/30/2020   Benign microscopic hematuria 08/31/2019   Exposure to chlamydia 04/09/2017   Hyperlipemia 12/27/2011   Mood disorder ([place]) 08/11/2011   Hx of adenomatous colonic polyps 08/11/2011   Onychomycosis 08/11/2011   History of hepatitis B 08/11/2011    Patient's Medications  New Prescriptions   No medications on file  Previous Medications   BUPROPION (WELLBUTRIN XL) 300 MG 24 HR TABLET    TAKE 1 TABLET ONCE DAILY.   CABOTEGRAVIR ER (APRETUDE) 600 MG/3ML INJECTION    Inject 3 mLs (600 mg total) into the muscle every 30 (thirty) days.   CABOTEGRAVIR ER (APRETUDE) 600 MG/3ML INJECTION    Inject 3 mLs (600 mg total) into the muscle every 2 (two) months.   DOXYCYCLINE (VIBRA-TABS) 100 MG TABLET    Take 1 tablet (100 mg total) by mouth 2 (two) times daily.   HYDROCODONE BIT-HOMATROPINE (HYCODAN) 5-1.5 MG/5ML SYRUP    Take 5 mLs by mouth every 8 (eight) hours as needed for cough.   PREDNISONE (DELTASONE) 10 MG TABLET    Take in tapering course as directed 6-5-4-3-2-1   ROSUVASTATIN (CRESTOR) 10 MG TABLET    Take 10 mg by mouth daily.   TADALAFIL (CIALIS) 20 MG TABLET    Take 0.5-1 tablets (10-20 mg total) by mouth every other day as needed for erectile dysfunction.  Modified Medications   No medications on file  Discontinued Medications   No medications on file    Allergies: Allergies  Allergen Reactions   Celebrex [Celecoxib]    Terbinafine Hcl    Vioxx [Rofecoxib]     Past Medical History: Past Medical History:  Diagnosis Date   Adenomatous colon polyp    Heart murmur    as a child   Hx of adenomatous colonic polyps 08/11/2011   Insomnia     Social History: Social History   Socioeconomic History    Marital status: Single    Spouse name: Not on file   Number of children: Not on file   Years of education: Not on file   Highest education level: Not on file  Occupational History   Not on file  Tobacco Use   Smoking status: Former    Packs/day: 0.50    Years: 30.00    Pack years: 15.00    Types: Cigarettes    Quit date: 06/11/2021    Years since quitting: 0.3    Passive exposure: Past   Smokeless tobacco: Never  Substance and Sexual Activity   Alcohol use: Yes    Alcohol/week: 0.0 standard drinks    Comment: rarely/ beer   Drug use: No   Sexual activity: Not on file  Other Topics Concern   Not on file  Social History Narrative   Not on file   Social Determinants of Health   Financial Resource Strain: Not on file  Food Insecurity: Not on file  Transportation Needs: Not on file  Physical Activity: Not on file  Stress: Not on file  Social Connections: Not on file    Labs: Lab Results  Component Value Date   HIV1RNAQUANT Not Detected 09/13/2021    RPR and STI Lab Results  Component Value Date   LABRPR NON-REACTIVE 09/13/2021  LABRPR Non Reactive 06/14/2019   LABRPR NON-REACTIVE 12/10/2018    STI Results GC GC CT CT  09/13/2021 Negative - Negative -  09/13/2021 Negative - Negative -  09/13/2021 Negative - Negative -  03/20/2017 - - - NOT DETECTED  03/05/2016 - NOT DETECTED - NOT DETECTED    Hepatitis B Lab Results  Component Value Date   HEPBSAB REACTIVE (A) 09/13/2021   HEPBSAG NON-REACTIVE 09/13/2021   Hepatitis C Lab Results  Component Value Date   HEPCAB NON-REACTIVE 09/13/2021   Hepatitis A Lab Results  Component Value Date   HAV NON-REACTIVE 09/13/2021   Lipids: Lab Results  Component Value Date   CHOL 183 09/14/2021   TRIG 113 09/14/2021   HDL 47 09/14/2021   CHOLHDL 3.9 09/14/2021   VLDL 24 12/27/2016   LDLCALC 114 (H) 09/14/2021    TARGET DATE: The 3rd of the month  Assessment: Robert Mora presents today for their Apretude  injection and to follow up for HIV PrEP. Initial/past injection was tolerated well without issues. No problems with systemic side effects of injection. Patient did experience some mild pain.  No signs or symptoms of anything today. Screened patient for acute HIV symptoms such as fatigue, muscle aches, rash, sore throat, lymphadenopathy, headache, night sweats, nausea/vomiting/diarrhea, and fever. Patient denies any symptoms. No new partners since last injection. Counseled that condoms are still advised and encouraged. Rapid HIV antibody blood test was drawn immediately prior to injection and was negative. Patient also had HIV RNA drawn today.   Administered cabotegravir 600mg /43mL in right upper outer quadrant of the gluteal muscle. Monitored patient for 10 minutes after injection. Injection was tolerated well without issue.  Last STI screening was 09/13/21 and was negative. No need for further kidney monitoring with Apretude. Will see him back in 2 months for injection, labs, and HIV PrEP follow up.  Plan: - Apretude injection administered - HIV RNA today - Next injection, labs, and PrEP follow up appointment scheduled for 12/12/20 - Call with any issues or questions  Gerard Cantara L. Kayleigh Broadwell, PharmD, BCIDP, AAHIVP, [place] Clinical Pharmacist Practitioner [place] [facility]

## 2021-10-11 NOTE — Addendum Note (Signed)
Addended by: Caffie Pinto on: 10/11/2021 02:53 PM   Modules accepted: Orders

## 2021-10-13 LAB — HIV-1 RNA QUANT-NO REFLEX-BLD
HIV 1 RNA Quant: NOT DETECTED Copies/mL
HIV-1 RNA Quant, Log: NOT DETECTED Log cps/mL

## 2021-10-15 ENCOUNTER — Telehealth: Payer: Self-pay | Admitting: Internal Medicine

## 2021-10-15 ENCOUNTER — Telehealth (INDEPENDENT_AMBULATORY_CARE_PROVIDER_SITE_OTHER): Payer: Medicare Other | Admitting: Internal Medicine

## 2021-10-15 ENCOUNTER — Other Ambulatory Visit: Payer: Self-pay

## 2021-10-15 DIAGNOSIS — U071 COVID-19: Secondary | ICD-10-CM | POA: Diagnosis not present

## 2021-10-15 MED ORDER — NIRMATRELVIR/RITONAVIR (PAXLOVID)TABLET: 3.0000 | ORAL_TABLET | Freq: Two times a day (BID) | ORAL | 0 refills | Status: AC

## 2021-10-15 MED ORDER — HYDROCODONE BIT-HOMATROP MBR 5-1.5 MG/5ML PO SOLN: 5.0000 mL | Freq: Three times a day (TID) | ORAL | 0 refills | Status: DC | PRN

## 2021-10-15 NOTE — Telephone Encounter (Signed)
scheduled

## 2021-10-15 NOTE — Telephone Encounter (Signed)
[name] Decoster [phone]  [name] called to say he tested positive for COVID yesterday. He started with headache on Saturday, stuffy head, head ache, tired, Video visit this afternoon.

## 2021-10-15 NOTE — Progress Notes (Signed)
   Subjective:    Patient ID: DARVIN DIALS, male    DOB: April 27, 1956, 65 y.o.   MRN: [id number]  HPI 65 year old Male seen today via interactive audio and video telecommunications due to the Coronavirus pandemic.  He is agreeable to visit in this format today.  He is at his home and I am at my office.  He is identified using 2 identifiers as Meiko Stranahan Denk, a longstanding patient in this practice.  Patient started on December 3 with headache, stuffy head, fatigue.  His partner recently tested positive for COVID-19.  Patient is under care of Infectious Disease clinic for PrEP treatment with Apretude.  He has a history of positive hepatitis B surface antibody.  He has been vaccinated for [employer].    Review of Systems symptoms consisting of malaise fatigue and upper respiratory infection as well as headache.  No vomiting.     Objective:   Physical Exam He is seen virtually in no acute distress but looks fatigued and is a bit pale       Assessment & Plan:  Acute COVID-19 virus infection  Plan: Treatment with Paxlovid was discussed and full-strength dose prescribed.  He will take Hycodan 1 teaspoon every 8 hours as needed for cough.  Advised to rest and drink plenty of fluids and to walk some to prevent atelectasis.  Quarantine for 5 days.

## 2021-10-15 NOTE — Patient Instructions (Signed)
Take Paxlovid regular strength as directed. Take Hycodan one tsp every 6 hours as needed for cough. Rest and drink fluids. Walk some to prevent atelectasis. Quarantine for 5 days.

## 2021-10-16 ENCOUNTER — Telehealth: Payer: Self-pay | Admitting: Internal Medicine

## 2021-10-16 NOTE — Telephone Encounter (Signed)
[place] results to [facility] [phone], phone [phone]   +COVID 10/14/2021

## 2021-10-19 ENCOUNTER — Ambulatory Visit: Payer: Medicare Other | Admitting: Pharmacist

## 2021-10-23 ENCOUNTER — Encounter: Payer: Self-pay | Admitting: Pharmacist

## 2021-10-30 ENCOUNTER — Ambulatory Visit: Payer: Medicare Other | Admitting: Pharmacist

## 2021-10-30 ENCOUNTER — Telehealth: Payer: Self-pay | Admitting: Pharmacist

## 2021-10-30 ENCOUNTER — Other Ambulatory Visit: Payer: Self-pay | Admitting: Internal Medicine

## 2021-10-30 DIAGNOSIS — M545 Low back pain, unspecified: Secondary | ICD-10-CM

## 2021-10-30 MED ORDER — HYDROCODONE-ACETAMINOPHEN 10-325 MG PO TABS: 1.0000 | ORAL_TABLET | Freq: Three times a day (TID) | ORAL | 0 refills | Status: AC | PRN

## 2021-10-30 NOTE — Telephone Encounter (Signed)
LVM with patient about cancelling today's appointment. If returns call, will discuss that he does not need COVID vaccine today as he currently is infected. Will immunize at next visit with Cassie if he wishes.  Robert Mora, PharmD, CPP Clinical Pharmacist Practitioner Infectious [place] for Infectious Disease

## 2021-11-05 ENCOUNTER — Encounter: Payer: Self-pay | Admitting: Internal Medicine

## 2021-11-26 ENCOUNTER — Other Ambulatory Visit (HOSPITAL_COMMUNITY): Payer: Self-pay

## 2021-11-27 ENCOUNTER — Other Ambulatory Visit (HOSPITAL_COMMUNITY): Payer: Self-pay

## 2021-11-28 ENCOUNTER — Telehealth: Payer: Self-pay

## 2021-11-28 NOTE — Telephone Encounter (Signed)
[facility] Patient Advocate Encounter  Patient's medication (Apretude) have been couriered to [facility] from [place] and will be administered on patient next office visit on 12/12/21.  [name] , [place] [facility] Patient [facility] for Infectious Disease Phone: [phone] Fax:  [phone]

## 2021-12-07 ENCOUNTER — Other Ambulatory Visit: Payer: Self-pay

## 2021-12-07 ENCOUNTER — Telehealth: Payer: Self-pay | Admitting: Internal Medicine

## 2021-12-07 MED ORDER — ROSUVASTATIN CALCIUM 10 MG PO TABS: 10.0000 mg | ORAL_TABLET | Freq: Every day | ORAL | 1 refills | Status: DC

## 2021-12-07 MED ORDER — ROSUVASTATIN CALCIUM 10 MG PO TABS: 10.0000 mg | ORAL_TABLET | Freq: Every day | ORAL | 3 refills | Status: AC

## 2021-12-07 NOTE — Telephone Encounter (Signed)
Refilled Crestor to [facility] for one year. Change of pharmacy requested by patient.

## 2021-12-07 NOTE — Telephone Encounter (Signed)
[facility] has never filled this medication they are requesting new prescription.  Received Fax RX request from  [place], [place] Phone:  [phone]  Fax:  [phone]      Medication - rosuvastatin (CRESTOR) 10 MG tablet  Last Refill -  Last OV - 09/17/2021  Last CPE - 09/17/2021  Next Appointment - 03/19/2022

## 2021-12-11 ENCOUNTER — Other Ambulatory Visit: Payer: Self-pay | Admitting: Internal Medicine

## 2021-12-12 ENCOUNTER — Other Ambulatory Visit: Payer: Medicare Other

## 2021-12-12 ENCOUNTER — Other Ambulatory Visit: Payer: Self-pay

## 2021-12-12 ENCOUNTER — Ambulatory Visit (INDEPENDENT_AMBULATORY_CARE_PROVIDER_SITE_OTHER): Payer: Medicare Other | Admitting: Pharmacist

## 2021-12-12 DIAGNOSIS — B20 Human immunodeficiency virus [HIV] disease: Secondary | ICD-10-CM | POA: Diagnosis not present

## 2021-12-12 DIAGNOSIS — Z113 Encounter for screening for infections with a predominantly sexual mode of transmission: Secondary | ICD-10-CM | POA: Diagnosis not present

## 2021-12-12 DIAGNOSIS — Z79899 Other long term (current) drug therapy: Secondary | ICD-10-CM

## 2021-12-12 MED ORDER — CABOTEGRAVIR ER 600 MG/3ML IM SUER
600.0000 mg | Freq: Once | INTRAMUSCULAR | Status: AC
  Administered 2021-12-12: 600 mg via INTRAMUSCULAR

## 2021-12-12 NOTE — Progress Notes (Signed)
HPI: Robert Mora is a 66 y.o. male who presents to the [place] clinic for Apretude administration and HIV PrEP follow up.  Patient Active Problem List   Diagnosis Date Noted   Musculoskeletal pain 08/07/2021   Acute bilateral low back pain without sciatica 01/30/2020   Benign microscopic hematuria 08/31/2019   Exposure to chlamydia 04/09/2017   Hyperlipemia 12/27/2011   Mood disorder ([place]) 08/11/2011   Hx of adenomatous colonic polyps 08/11/2011   Onychomycosis 08/11/2011   History of hepatitis B 08/11/2011    Patient's Medications  New Prescriptions   No medications on file  Previous Medications   BUPROPION (WELLBUTRIN XL) 300 MG 24 HR TABLET    TAKE 1 TABLET ONCE DAILY.   CABOTEGRAVIR ER (APRETUDE) 600 MG/3ML INJECTION    Inject 3 mLs (600 mg total) into the muscle every 30 (thirty) days.   CABOTEGRAVIR ER (APRETUDE) 600 MG/3ML INJECTION    Inject 3 mLs (600 mg total) into the muscle every 2 (two) months.   DOXYCYCLINE (VIBRA-TABS) 100 MG TABLET    Take 1 tablet (100 mg total) by mouth 2 (two) times daily.   HYDROCODONE BIT-HOMATROPINE (HYCODAN) 5-1.5 MG/5ML SYRUP    Take 5 mLs by mouth every 8 (eight) hours as needed for cough.   PREDNISONE (DELTASONE) 10 MG TABLET    Take in tapering course as directed 6-5-4-3-2-1   ROSUVASTATIN (CRESTOR) 10 MG TABLET    Take 1 tablet (10 mg total) by mouth daily.   TADALAFIL (CIALIS) 20 MG TABLET    Take 0.5-1 tablets (10-20 mg total) by mouth every other day as needed for erectile dysfunction.  Modified Medications   No medications on file  Discontinued Medications   No medications on file    Allergies: Allergies  Allergen Reactions   Celebrex [Celecoxib]    Terbinafine Hcl    Vioxx [Rofecoxib]     Past Medical History: Past Medical History:  Diagnosis Date   Adenomatous colon polyp    Heart murmur    as a child   Hx of adenomatous colonic polyps 08/11/2011   Insomnia     Social History: Social History    Socioeconomic History   Marital status: Single    Spouse name: Not on file   Number of children: Not on file   Years of education: Not on file   Highest education level: Not on file  Occupational History   Not on file  Tobacco Use   Smoking status: Former    Packs/day: 0.50    Years: 30.00    Pack years: 15.00    Types: Cigarettes    Quit date: 06/11/2021    Years since quitting: 0.5    Passive exposure: Past   Smokeless tobacco: Never  Substance and Sexual Activity   Alcohol use: Yes    Alcohol/week: 0.0 standard drinks    Comment: rarely/ beer   Drug use: No   Sexual activity: Not on file  Other Topics Concern   Not on file  Social History Narrative   Not on file   Social Determinants of Health   Financial Resource Strain: Not on file  Food Insecurity: Not on file  Transportation Needs: Not on file  Physical Activity: Not on file  Stress: Not on file  Social Connections: Not on file    Labs: Lab Results  Component Value Date   HIV1RNAQUANT Not Detected 10/11/2021   HIV1RNAQUANT Not Detected 09/13/2021    RPR and STI Lab Results  Component Value Date   LABRPR NON-REACTIVE 09/13/2021   LABRPR Non Reactive 06/14/2019   LABRPR NON-REACTIVE 12/10/2018    STI Results GC GC CT CT  09/13/2021 Negative - Negative -  09/13/2021 Negative - Negative -  09/13/2021 Negative - Negative -  03/20/2017 - - - NOT DETECTED  03/05/2016 - NOT DETECTED - NOT DETECTED    Hepatitis B Lab Results  Component Value Date   HEPBSAB REACTIVE (A) 09/13/2021   HEPBSAG NON-REACTIVE 09/13/2021   Hepatitis C Lab Results  Component Value Date   HEPCAB NON-REACTIVE 09/13/2021   Hepatitis A Lab Results  Component Value Date   HAV NON-REACTIVE 09/13/2021   Lipids: Lab Results  Component Value Date   CHOL 183 09/14/2021   TRIG 113 09/14/2021   HDL 47 09/14/2021   CHOLHDL 3.9 09/14/2021   VLDL 24 12/27/2016   LDLCALC 114 (H) 09/14/2021    TARGET  DATE: 3rd  Assessment: Robert Mora presents today for their Apretude injection and to follow up for HIV PrEP. No issues with past injections. No known exposures to any STIs and no signs or symptoms of any STIs today. Last STI screening was 09/13/21 and was negative. Screened patient for acute HIV symptoms such as fatigue, muscle aches, rash, sore throat, lymphadenopathy, headache, night sweats, nausea/vomiting/diarrhea, and fever. Patient denies any symptoms. No new partners since last injection. No condom use.  HIV antibody blood test was drawn immediately prior to injection and was negative. Patient also had HIV RNA drawn today.   Administered cabotegravir 600mg /56mL in right upper outer quadrant of the gluteal muscle. Monitored patient for 10 minutes after injection. Injection was tolerated well without issue. Will see him back in 2 months for injection, labs, and HIV PrEP follow up.  Plan: - Apretude injection administered - HIV RNA today - Next injection, labs, and PrEP follow up appointment scheduled for 02/13/22 - Call with any issues or questions  Kardell Virgil L. Jermaine Neuharth, PharmD, BCIDP, AAHIVP, [place] Clinical Pharmacist Practitioner [place] for Infectious Disease

## 2021-12-14 LAB — HIV-1 RNA QUANT-NO REFLEX-BLD
HIV 1 RNA Quant: NOT DETECTED Copies/mL
HIV-1 RNA Quant, Log: NOT DETECTED Log cps/mL

## 2022-01-21 ENCOUNTER — Other Ambulatory Visit (HOSPITAL_COMMUNITY): Payer: Self-pay

## 2022-01-22 ENCOUNTER — Telehealth: Payer: Self-pay

## 2022-01-22 NOTE — Telephone Encounter (Signed)
[facility] Patient Advocate Encounter ? ?Patient's medication (Apretude) have been couriered to [facility] from [place] and will be administered on the patient next office visit on 02/13/22. ? ?[name] , CPhT ?Specialty Pharmacy Patient Advocate ?[place] for Infectious Disease ?Phone: [phone] ?Fax:  [phone]  ?

## 2022-02-11 ENCOUNTER — Other Ambulatory Visit: Payer: Medicare Other

## 2022-02-13 ENCOUNTER — Other Ambulatory Visit: Payer: Self-pay

## 2022-02-13 ENCOUNTER — Other Ambulatory Visit: Payer: Medicare Other

## 2022-02-13 ENCOUNTER — Ambulatory Visit (INDEPENDENT_AMBULATORY_CARE_PROVIDER_SITE_OTHER): Payer: Medicare Other | Admitting: Pharmacist

## 2022-02-13 ENCOUNTER — Other Ambulatory Visit: Payer: Self-pay | Admitting: Pharmacist

## 2022-02-13 ENCOUNTER — Other Ambulatory Visit (HOSPITAL_COMMUNITY)
Admission: RE | Admit: 2022-02-13 | Discharge: 2022-02-13 | Disposition: A | Discharge status: Home / Self Care | Payer: Medicare Other | Source: Ambulatory Visit | Attending: Infectious Disease | Admitting: Infectious Disease

## 2022-02-13 DIAGNOSIS — Z79899 Other long term (current) drug therapy: Secondary | ICD-10-CM

## 2022-02-13 DIAGNOSIS — Z113 Encounter for screening for infections with a predominantly sexual mode of transmission: Secondary | ICD-10-CM

## 2022-02-13 MED ORDER — CABOTEGRAVIR ER 600 MG/3ML IM SUER
600.0000 mg | Freq: Once | INTRAMUSCULAR | Status: AC
  Administered 2022-02-13: 600 mg via INTRAMUSCULAR

## 2022-02-13 NOTE — Progress Notes (Signed)
? ?HPI: Robert Mora is a 66 y.o. male who presents to the [place] clinic for Apretude administration and HIV PrEP follow up. ? ?Patient Active Problem List  ? Diagnosis Date Noted  ? Musculoskeletal pain 08/07/2021  ? Acute bilateral low back pain without sciatica 01/30/2020  ? Benign microscopic hematuria 08/31/2019  ? Exposure to chlamydia 04/09/2017  ? Hyperlipemia 12/27/2011  ? Mood disorder ([place]) 08/11/2011  ? Hx of adenomatous colonic polyps 08/11/2011  ? Onychomycosis 08/11/2011  ? History of hepatitis B 08/11/2011  ? ? ?Patient's Medications  ?New Prescriptions  ? No medications on file  ?Previous Medications  ? BUPROPION (WELLBUTRIN XL) 300 MG 24 HR TABLET    TAKE 1 TABLET ONCE DAILY.  ? CABOTEGRAVIR ER (APRETUDE) 600 MG/3ML INJECTION    Inject 3 mLs (600 mg total) into the muscle every 30 (thirty) days.  ? CABOTEGRAVIR ER (APRETUDE) 600 MG/3ML INJECTION    Inject 3 mLs (600 mg total) into the muscle every 2 (two) months.  ? DOXYCYCLINE (VIBRA-TABS) 100 MG TABLET    Take 1 tablet (100 mg total) by mouth 2 (two) times daily.  ? HYDROCODONE BIT-HOMATROPINE (HYCODAN) 5-1.5 MG/5ML SYRUP    Take 5 mLs by mouth every 8 (eight) hours as needed for cough.  ? PREDNISONE (DELTASONE) 10 MG TABLET    Take in tapering course as directed 6-5-4-3-2-1  ? ROSUVASTATIN (CRESTOR) 10 MG TABLET    Take 1 tablet (10 mg total) by mouth daily.  ? TADALAFIL (CIALIS) 20 MG TABLET    Take 0.5-1 tablets (10-20 mg total) by mouth every other day as needed for erectile dysfunction.  ?Modified Medications  ? No medications on file  ?Discontinued Medications  ? No medications on file  ? ? ?Allergies: ?Allergies  ?Allergen Reactions  ? Celebrex [Celecoxib]   ? Terbinafine Hcl   ? Vioxx [Rofecoxib]   ? ? ?Past Medical History: ?Past Medical History:  ?Diagnosis Date  ? Adenomatous colon polyp   ? Heart murmur   ? as a child  ? Hx of adenomatous colonic polyps 08/11/2011  ? Insomnia   ? ? ?Social History: ?Social History   ? ?Socioeconomic History  ? Marital status: Single  ?  Spouse name: Not on file  ? Number of children: Not on file  ? Years of education: Not on file  ? Highest education level: Not on file  ?Occupational History  ? Not on file  ?Tobacco Use  ? Smoking status: Former  ?  Packs/day: 0.50  ?  Years: 30.00  ?  Pack years: 15.00  ?  Types: Cigarettes  ?  Quit date: 06/11/2021  ?  Years since quitting: 0.6  ?  Passive exposure: Past  ? Smokeless tobacco: Never  ?Substance and Sexual Activity  ? Alcohol use: Yes  ?  Alcohol/week: 0.0 standard drinks  ?  Comment: rarely/ beer  ? Drug use: No  ? Sexual activity: Not on file  ?Other Topics Concern  ? Not on file  ?Social History Narrative  ? Not on file  ? ?Social Determinants of Health  ? ?Financial Resource Strain: Not on file  ?Food Insecurity: Not on file  ?Transportation Needs: Not on file  ?Physical Activity: Not on file  ?Stress: Not on file  ?Social Connections: Not on file  ? ? ?Labs: ?Lab Results  ?Component Value Date  ? HIV1RNAQUANT Not Detected 12/12/2021  ? HIV1RNAQUANT Not Detected 10/11/2021  ? HIV1RNAQUANT Not Detected 09/13/2021  ? ? ?  RPR and STI ?Lab Results  ?Component Value Date  ? LABRPR NON-REACTIVE 09/13/2021  ? LABRPR Non Reactive 06/14/2019  ? LABRPR NON-REACTIVE 12/10/2018  ? ? ?STI Results GC GC CT CT  ?09/13/2021 ? 4:21 PM Negative    ? Negative    Negative    ? Negative     ?09/13/2021 ? 4:06 PM Negative    Negative     ?03/20/2017 ?12:25 PM    NOT DETECTED    ?03/05/2016 ? 3:47 PM  NOT DETECTED    NOT DETECTED    ? ? ?Hepatitis B ?Lab Results  ?Component Value Date  ? HEPBSAB REACTIVE (A) 09/13/2021  ? HEPBSAG NON-REACTIVE 09/13/2021  ? ?Hepatitis C ?Lab Results  ?Component Value Date  ? HEPCAB NON-REACTIVE 09/13/2021  ? ?Hepatitis A ?Lab Results  ?Component Value Date  ? HAV NON-REACTIVE 09/13/2021  ? ?Lipids: ?Lab Results  ?Component Value Date  ? CHOL 183 09/14/2021  ? TRIG 113 09/14/2021  ? HDL 47 09/14/2021  ? CHOLHDL 3.9 09/14/2021  ? VLDL 24  12/27/2016  ? LDLCALC 114 (H) 09/14/2021  ? ? ?TARGET DATE: ?The 3rd ? ?Assessment: ?Robert Mora presents today for their Apretude injection and to follow up for HIV PrEP. No issues with past injections. No known exposures to any STIs and no signs or symptoms of any STIs today. Last STI screening was 09/14/21 and was negative. Screened patient for acute HIV symptoms such as fatigue, muscle aches, rash, sore throat, lymphadenopathy, headache, night sweats, nausea/vomiting/diarrhea, and fever. Patient denies any symptoms. No new partners since last injection. Rapid HIV blood test was drawn immediately prior to injection and was negative. Patient also had HIV RNA drawn today. Will update screening for STIs today. He states that he does not have any need for pharyngeal or rectal screenings so will defer those today. ? ?Administered cabotegravir '600mg'$ /28m in right upper outer quadrant of the gluteal muscle. Monitored patient for 10 minutes after injection. Injection was tolerated well without issue. Will see him back in 2 months for injection, labs, and HIV PrEP follow up. ? ?Plan: ?- Apretude injection administered ?- HIV RNA, RPR, urine cytology today ?- Next injection, labs, and PrEP follow up appointment scheduled for 04/10/22 and 06/12/22 ?- Call with any issues or questions ? ?Kees Idrovo L. Denny Lave, PharmD, BCIDP, AAHIVP, CPP ?Clinical Pharmacist Practitioner ?Infectious Diseases Clinical Pharmacist ?[facility]  ?

## 2022-02-14 LAB — URINE CYTOLOGY ANCILLARY ONLY
Chlamydia: NEGATIVE
Comment: NEGATIVE
Comment: NORMAL
Neisseria Gonorrhea: NEGATIVE

## 2022-02-17 LAB — RPR: RPR Ser Ql: NONREACTIVE

## 2022-02-17 LAB — HIV-1 RNA QUANT-NO REFLEX-BLD
HIV 1 RNA Quant: NOT DETECTED Copies/mL
HIV-1 RNA Quant, Log: NOT DETECTED Log cps/mL

## 2022-03-11 ENCOUNTER — Other Ambulatory Visit (HOSPITAL_COMMUNITY): Payer: Self-pay

## 2022-03-18 ENCOUNTER — Other Ambulatory Visit: Payer: Medicare Other

## 2022-03-18 DIAGNOSIS — E7800 Pure hypercholesterolemia, unspecified: Secondary | ICD-10-CM

## 2022-03-19 ENCOUNTER — Encounter: Payer: Self-pay | Admitting: Internal Medicine

## 2022-03-19 ENCOUNTER — Ambulatory Visit (INDEPENDENT_AMBULATORY_CARE_PROVIDER_SITE_OTHER): Payer: Medicare Other | Admitting: Internal Medicine

## 2022-03-19 VITALS — BP 108/72 | HR 71 | Temp 97.3°F | Ht 67.25 in | Wt 154.2 lb

## 2022-03-19 DIAGNOSIS — M545 Low back pain, unspecified: Secondary | ICD-10-CM

## 2022-03-19 DIAGNOSIS — Z8669 Personal history of other diseases of the nervous system and sense organs: Secondary | ICD-10-CM | POA: Diagnosis not present

## 2022-03-19 DIAGNOSIS — E7800 Pure hypercholesterolemia, unspecified: Secondary | ICD-10-CM | POA: Diagnosis not present

## 2022-03-19 LAB — LIPID PANEL
Cholesterol: 153 mg/dL (ref <200)
HDL: 46 mg/dL (ref >=40)
LDL Cholesterol (Calc): 86 mg/dL (calc)
Non-HDL Cholesterol (Calc): 107 mg/dL (calc) (ref <130)
Total CHOL/HDL Ratio: 3.3 (calc) (ref <5.0)
Triglycerides: 111 mg/dL (ref <150)

## 2022-03-19 MED ORDER — HYDROCODONE-ACETAMINOPHEN 5-325 MG PO TABS: 1.0000 | ORAL_TABLET | Freq: Four times a day (QID) | ORAL | 0 refills | Status: DC | PRN

## 2022-03-19 NOTE — Progress Notes (Signed)
   Subjective:    Patient ID: Robert Mora, male    DOB: [birth year], 66 y.o.   MRN: [id number]  HPI 66 year old Male for 79-monthfollow up.  History of hyperlipidemia treated with Crestor 10 mg daily.  Has some low back pain and occasionally needs hydrocodone APAP 5/325 to take sparingly.  Receives Apretude injections 600 mg IM every 2 months through infectious disease clinic.  Had COVID-19 in December treated with Paxlovid.    Review of Systems no new complaints-generally feels well.     Objective:   Physical Exam Blood pressure 108/72, pulse 71, temperature 97.3 degrees, pulse oximetry 96%, weight 154 pounds 4 ounces ,BMI 23.98 Neck is supple without JVD thyromegaly or carotid bruits.  Chest clear.  Cardiac exam: Regular rate and rhythm.  No lower extremity edema.  Lipid panel is completely within normal limits on statin medication consisting of Crestor 10 mg daily.    Assessment & Plan:   Pure hypercholesterolemia-stable on Crestor 10 mg daily  History of recurrent low back pain treated sparingly with hydrocodone APAP if needed.  History of sciatica in 2011 affecting right buttock and right leg.  History of COVID-29 October 2021.  Plan: He will continue with current medications and return in December for Medicare wellness and health maintenance exam.  Hydrocodone APAP refilled.

## 2022-03-29 ENCOUNTER — Other Ambulatory Visit (HOSPITAL_COMMUNITY): Payer: Self-pay

## 2022-04-01 ENCOUNTER — Other Ambulatory Visit (HOSPITAL_COMMUNITY): Payer: Self-pay

## 2022-04-01 ENCOUNTER — Telehealth: Payer: Self-pay

## 2022-04-01 NOTE — Telephone Encounter (Signed)
[facility] Patient Advocate Encounter  Patient's medication (Apretude) have been couriered to [facility] from [place] and will be administered on the patient next office visit on 04/10/22.  [name] , [place] [facility] Patient [facility] for Infectious Disease Phone: [phone] Fax:  [phone]

## 2022-04-07 NOTE — Patient Instructions (Addendum)
It was a pleasure to see you today.  Continue current medications and follow-up for Medicare wellness visit and health maintenance exam in early December.  Hydrocodone APAP refilled.

## 2022-04-10 ENCOUNTER — Other Ambulatory Visit: Payer: Self-pay

## 2022-04-10 ENCOUNTER — Ambulatory Visit (INDEPENDENT_AMBULATORY_CARE_PROVIDER_SITE_OTHER): Payer: Medicare Other | Admitting: Pharmacist

## 2022-04-10 ENCOUNTER — Other Ambulatory Visit: Payer: Medicare Other

## 2022-04-10 ENCOUNTER — Other Ambulatory Visit (HOSPITAL_COMMUNITY)
Admission: RE | Admit: 2022-04-10 | Discharge: 2022-04-10 | Disposition: A | Discharge status: Home / Self Care | Payer: Medicare Other | Source: Ambulatory Visit | Attending: Infectious Disease | Admitting: Infectious Disease

## 2022-04-10 DIAGNOSIS — Z113 Encounter for screening for infections with a predominantly sexual mode of transmission: Secondary | ICD-10-CM | POA: Diagnosis not present

## 2022-04-10 DIAGNOSIS — Z79899 Other long term (current) drug therapy: Secondary | ICD-10-CM | POA: Diagnosis not present

## 2022-04-10 MED ORDER — CABOTEGRAVIR ER 600 MG/3ML IM SUER
600.0000 mg | Freq: Once | INTRAMUSCULAR | Status: AC
  Administered 2022-04-10: 600 mg via INTRAMUSCULAR

## 2022-04-10 NOTE — Progress Notes (Signed)
HPI: Robert Mora is a 66 y.o. male who presents to the [place] clinic for Apretude administration and HIV PrEP follow up.  Patient Active Problem List   Diagnosis Date Noted   Musculoskeletal pain 08/07/2021   Acute bilateral low back pain without sciatica 01/30/2020   Benign microscopic hematuria 08/31/2019   Exposure to chlamydia 04/09/2017   Hyperlipemia 12/27/2011   Mood disorder ([place]) 08/11/2011   Hx of adenomatous colonic polyps 08/11/2011   Onychomycosis 08/11/2011   History of hepatitis B 08/11/2011    Patient's Medications  New Prescriptions   No medications on file  Previous Medications   BUPROPION (WELLBUTRIN XL) 300 MG 24 HR TABLET    TAKE 1 TABLET ONCE DAILY.   CABOTEGRAVIR ER (APRETUDE) 600 MG/3ML INJECTION    Inject 3 mLs (600 mg total) into the muscle every 30 (thirty) days.   CABOTEGRAVIR ER (APRETUDE) 600 MG/3ML INJECTION    Inject 3 mLs (600 mg total) into the muscle every 2 (two) months.   HYDROCODONE-ACETAMINOPHEN (NORCO) 5-325 MG TABLET    Take 1 tablet by mouth every 6 (six) hours as needed for moderate pain.   ROSUVASTATIN (CRESTOR) 10 MG TABLET    Take 1 tablet (10 mg total) by mouth daily.   TADALAFIL (CIALIS) 20 MG TABLET    Take 0.5-1 tablets (10-20 mg total) by mouth every other day as needed for erectile dysfunction.  Modified Medications   No medications on file  Discontinued Medications   No medications on file    Allergies: Allergies  Allergen Reactions   Celebrex [Celecoxib]    Terbinafine Hcl    Vioxx [Rofecoxib]     Past Medical History: Past Medical History:  Diagnosis Date   Adenomatous colon polyp    Heart murmur    as a child   Hx of adenomatous colonic polyps 08/11/2011   Insomnia     Social History: Social History   Socioeconomic History   Marital status: Single    Spouse name: Not on file   Number of children: Not on file   Years of education: Not on file   Highest education level: Not on file   Occupational History   Not on file  Tobacco Use   Smoking status: Former    Packs/day: 0.50    Years: 30.00    Pack years: 15.00    Types: Cigarettes    Quit date: 06/11/2021    Years since quitting: 0.8    Passive exposure: Past   Smokeless tobacco: Never  Substance and Sexual Activity   Alcohol use: Yes    Alcohol/week: 0.0 standard drinks    Comment: rarely/ beer   Drug use: No   Sexual activity: Not on file  Other Topics Concern   Not on file  Social History Narrative   Not on file   Social Determinants of Health   Financial Resource Strain: Not on file  Food Insecurity: Not on file  Transportation Needs: Not on file  Physical Activity: Not on file  Stress: Not on file  Social Connections: Not on file    Labs: Lab Results  Component Value Date   HIV1RNAQUANT Not Detected 02/13/2022   HIV1RNAQUANT Not Detected 12/12/2021   HIV1RNAQUANT Not Detected 10/11/2021    RPR and STI Lab Results  Component Value Date   LABRPR NON-REACTIVE 02/13/2022   LABRPR NON-REACTIVE 09/13/2021   LABRPR Non Reactive 06/14/2019   LABRPR NON-REACTIVE 12/10/2018    STI Results GC GC CT CT  02/13/2022  8:35 AM Negative    Negative     09/13/2021  4:21 PM Negative     Negative    Negative     Negative     09/13/2021  4:06 PM Negative    Negative     03/20/2017 12:25 PM    NOT DETECTED    03/05/2016  3:47 PM  NOT DETECTED    NOT DETECTED      Hepatitis B Lab Results  Component Value Date   HEPBSAB REACTIVE (A) 09/13/2021   HEPBSAG NON-REACTIVE 09/13/2021   Hepatitis C Lab Results  Component Value Date   HEPCAB NON-REACTIVE 09/13/2021   Hepatitis A Lab Results  Component Value Date   HAV NON-REACTIVE 09/13/2021   Lipids: Lab Results  Component Value Date   CHOL 153 03/18/2022   TRIG 111 03/18/2022   HDL 46 03/18/2022   CHOLHDL 3.3 03/18/2022   VLDL 24 12/27/2016   LDLCALC 86 03/18/2022    TARGET DATE: 3rd of the month  Assessment: Robert Mora presents today  for their Apretude injection and to follow up for HIV PrEP. No issues with past injections. No known exposures to any STIs and no signs or symptoms of any STIs today. Last STI screening was 02/13/22 and was negative. Screened patient for acute HIV symptoms such as fatigue, muscle aches, rash, sore throat, lymphadenopathy, headache, night sweats, nausea/vomiting/diarrhea, and fever. Patient denies any symptoms. No new partners since last injection. Rapid HIV blood test was drawn immediately prior to injection and was negative. Patient also had HIV RNA drawn today. STI testing (oral, rectal, urine) obtained today  Administered cabotegravir '600mg'$ /80m in right upper outer quadrant of the gluteal muscle. Monitored patient for 10 minutes after injection. Injection was tolerated well without issue. Will see him back in 2 months for injection, labs, and HIV PrEP follow up.  Plan: - Apretude injection administered - F/u HIV RNA and STI testing for treatment if necessary - Next injection, labs, and PrEP follow up appointment scheduled for 8/2 with Dr. KEber Hong- Call with any issues or questions  MVarney Daily PharmD PGY1 Pharmacy Resident [facility]

## 2022-04-11 LAB — CYTOLOGY, (ORAL, ANAL, URETHRAL) ANCILLARY ONLY
Chlamydia: NEGATIVE
Chlamydia: NEGATIVE
Comment: NEGATIVE
Comment: NEGATIVE
Comment: NORMAL
Comment: NORMAL
Neisseria Gonorrhea: NEGATIVE
Neisseria Gonorrhea: NEGATIVE

## 2022-04-11 LAB — URINE CYTOLOGY ANCILLARY ONLY
Chlamydia: NEGATIVE
Comment: NEGATIVE
Comment: NORMAL
Neisseria Gonorrhea: NEGATIVE

## 2022-04-14 LAB — HIV-1 RNA QUANT-NO REFLEX-BLD
HIV 1 RNA Quant: NOT DETECTED Copies/mL
HIV-1 RNA Quant, Log: NOT DETECTED Log cps/mL

## 2022-04-15 DIAGNOSIS — N402 Nodular prostate without lower urinary tract symptoms: Secondary | ICD-10-CM | POA: Diagnosis not present

## 2022-04-17 ENCOUNTER — Ambulatory Visit: Payer: Medicare Other | Admitting: Pharmacist

## 2022-04-23 DIAGNOSIS — N402 Nodular prostate without lower urinary tract symptoms: Secondary | ICD-10-CM | POA: Diagnosis not present

## 2022-04-23 DIAGNOSIS — L918 Other hypertrophic disorders of the skin: Secondary | ICD-10-CM | POA: Diagnosis not present

## 2022-05-06 ENCOUNTER — Other Ambulatory Visit (HOSPITAL_COMMUNITY): Payer: Self-pay

## 2022-05-27 ENCOUNTER — Other Ambulatory Visit (HOSPITAL_COMMUNITY): Payer: Self-pay

## 2022-05-27 DIAGNOSIS — H5203 Hypermetropia, bilateral: Secondary | ICD-10-CM | POA: Diagnosis not present

## 2022-05-28 ENCOUNTER — Telehealth: Payer: Self-pay

## 2022-05-28 DIAGNOSIS — L918 Other hypertrophic disorders of the skin: Secondary | ICD-10-CM | POA: Diagnosis not present

## 2022-05-28 NOTE — Telephone Encounter (Signed)
[facility] Patient Advocate Encounter  Patient's medication (Apretude) have been couriered to [facility] from [place] and will be administered on the patient next office visit on 06/12/22.  [name] , [place] [facility] Patient [facility] for Infectious Disease Phone: [phone] Fax:  [phone]

## 2022-06-10 ENCOUNTER — Other Ambulatory Visit: Payer: Self-pay | Admitting: Internal Medicine

## 2022-06-10 DIAGNOSIS — M545 Low back pain, unspecified: Secondary | ICD-10-CM

## 2022-06-10 MED ORDER — HYDROCODONE-ACETAMINOPHEN 5-325 MG PO TABS: 1.0000 | ORAL_TABLET | Freq: Four times a day (QID) | ORAL | 0 refills | Status: AC | PRN

## 2022-06-11 ENCOUNTER — Other Ambulatory Visit: Payer: Self-pay | Admitting: Pharmacist

## 2022-06-11 DIAGNOSIS — Z79899 Other long term (current) drug therapy: Secondary | ICD-10-CM

## 2022-06-12 ENCOUNTER — Other Ambulatory Visit: Payer: Self-pay | Admitting: Pharmacist

## 2022-06-12 ENCOUNTER — Ambulatory Visit (INDEPENDENT_AMBULATORY_CARE_PROVIDER_SITE_OTHER): Payer: Medicare Other | Admitting: Pharmacist

## 2022-06-12 ENCOUNTER — Other Ambulatory Visit: Payer: Medicare Other

## 2022-06-12 ENCOUNTER — Other Ambulatory Visit: Payer: Self-pay

## 2022-06-12 DIAGNOSIS — Z79899 Other long term (current) drug therapy: Secondary | ICD-10-CM

## 2022-06-12 DIAGNOSIS — Z113 Encounter for screening for infections with a predominantly sexual mode of transmission: Secondary | ICD-10-CM | POA: Diagnosis not present

## 2022-06-12 MED ORDER — CABOTEGRAVIR ER 600 MG/3ML IM SUER
600.0000 mg | Freq: Once | INTRAMUSCULAR | Status: AC
  Administered 2022-06-12: 600 mg via INTRAMUSCULAR

## 2022-06-12 NOTE — Progress Notes (Signed)
v

## 2022-06-12 NOTE — Patient Instructions (Signed)
[email or web address]  [place] for PrEP

## 2022-06-12 NOTE — Progress Notes (Signed)
HPI: Robert Mora is a 66 y.o. male who presents to the [place] clinic for Apretude administration and HIV PrEP follow up.  Patient Active Problem List   Diagnosis Date Noted   Musculoskeletal pain 08/07/2021   Acute bilateral low back pain without sciatica 01/30/2020   Benign microscopic hematuria 08/31/2019   Exposure to chlamydia 04/09/2017   Hyperlipemia 12/27/2011   Mood disorder ([place]) 08/11/2011   Hx of adenomatous colonic polyps 08/11/2011   Onychomycosis 08/11/2011   History of hepatitis B 08/11/2011    Patient's Medications  New Prescriptions   No medications on file  Previous Medications   BUPROPION (WELLBUTRIN XL) 300 MG 24 HR TABLET    TAKE 1 TABLET ONCE DAILY.   CABOTEGRAVIR ER (APRETUDE) 600 MG/3ML INJECTION    Inject 3 mLs (600 mg total) into the muscle every 30 (thirty) days.   CABOTEGRAVIR ER (APRETUDE) 600 MG/3ML INJECTION    Inject 3 mLs (600 mg total) into the muscle every 2 (two) months.   HYDROCODONE-ACETAMINOPHEN (NORCO) 5-325 MG TABLET    Take 1 tablet by mouth every 6 (six) hours as needed for up to 7 days for moderate pain.   ROSUVASTATIN (CRESTOR) 10 MG TABLET    Take 1 tablet (10 mg total) by mouth daily.   TADALAFIL (CIALIS) 20 MG TABLET    Take 0.5-1 tablets (10-20 mg total) by mouth every other day as needed for erectile dysfunction.  Modified Medications   No medications on file  Discontinued Medications   No medications on file    Allergies: Allergies  Allergen Reactions   Celebrex [Celecoxib]    Terbinafine Hcl    Vioxx [Rofecoxib]     Past Medical History: Past Medical History:  Diagnosis Date   Adenomatous colon polyp    Heart murmur    as a child   Hx of adenomatous colonic polyps 08/11/2011   Insomnia     Social History: Social History   Socioeconomic History   Marital status: Single    Spouse name: Not on file   Number of children: Not on file   Years of education: Not on file   Highest education level: Not on  file  Occupational History   Not on file  Tobacco Use   Smoking status: Former    Packs/day: 0.50    Years: 30.00    Total pack years: 15.00    Types: Cigarettes    Quit date: 06/11/2021    Years since quitting: 1.0    Passive exposure: Past   Smokeless tobacco: Never  Substance and Sexual Activity   Alcohol use: Yes    Alcohol/week: 0.0 standard drinks of alcohol    Comment: rarely/ beer   Drug use: No   Sexual activity: Not on file  Other Topics Concern   Not on file  Social History Narrative   Not on file   Social Determinants of Health   Financial Resource Strain: Not on file  Food Insecurity: Not on file  Transportation Needs: Not on file  Physical Activity: Not on file  Stress: Not on file  Social Connections: Not on file    Labs: Lab Results  Component Value Date   HIV1RNAQUANT Not Detected 04/10/2022   HIV1RNAQUANT Not Detected 02/13/2022   HIV1RNAQUANT Not Detected 12/12/2021    RPR and STI Lab Results  Component Value Date   LABRPR NON-REACTIVE 02/13/2022   LABRPR NON-REACTIVE 09/13/2021   LABRPR Non Reactive 06/14/2019   LABRPR NON-REACTIVE 12/10/2018  STI Results GC GC CT CT  04/10/2022  2:17 PM Negative    Negative    Negative   Negative    Negative    Negative    02/13/2022  8:35 AM Negative   Negative    09/13/2021  4:21 PM Negative    Negative   Negative    Negative    09/13/2021  4:06 PM Negative   Negative    03/20/2017 12:25 PM    NOT DETECTED   03/05/2016  3:47 PM  NOT DETECTED   NOT DETECTED     Hepatitis B Lab Results  Component Value Date   HEPBSAB REACTIVE (A) 09/13/2021   HEPBSAG NON-REACTIVE 09/13/2021   Hepatitis C Lab Results  Component Value Date   HEPCAB NON-REACTIVE 09/13/2021   Hepatitis A Lab Results  Component Value Date   HAV NON-REACTIVE 09/13/2021   Lipids: Lab Results  Component Value Date   CHOL 153 03/18/2022   TRIG 111 03/18/2022   HDL 46 03/18/2022   CHOLHDL 3.3 03/18/2022   VLDL 24  12/27/2016   LDLCALC 86 03/18/2022    TARGET DATE: The 3rd  Assessment: Robert Mora presents today for his Apretude injection and to follow up for HIV PrEP. No issues with past injections. No known exposures to any STIs and no signs or symptoms of any STIs today. Last STI screening was 04/10/22 and was negative. Screened for acute HIV symptoms such as fatigue, muscle aches, rash, sore throat, lymphadenopathy, headache, night sweats, nausea/vomiting/diarrhea, and fever. Denies any symptoms. No new partners since last injection. Rapid HIV blood test was drawn immediately prior to injection and was negative. HIV RNA collected today as well.  Administered cabotegravir '600mg'$ /46m in right upper outer quadrant of the gluteal muscle. Injection was tolerated well without issue. Will see him back in 2 months for injection, labs, and HIV PrEP follow up.  [place].  Plan: - Apretude injection administered - HIV RNA today - Next injection, labs, and PrEP follow up appointment scheduled for 08/13/22 - Call with any issues or questions  Navea Woodrow L. Nikole Swartzentruber, PharmD, BCIDP, AAHIVP, CWayne HeightsClinical Pharmacist Practitioner [facility]

## 2022-06-16 LAB — HIV-1 RNA QUANT-NO REFLEX-BLD
HIV 1 RNA Quant: NOT DETECTED Copies/mL
HIV-1 RNA Quant, Log: NOT DETECTED Log cps/mL

## 2022-06-25 ENCOUNTER — Other Ambulatory Visit: Payer: Self-pay | Admitting: Internal Medicine

## 2022-08-02 ENCOUNTER — Other Ambulatory Visit (HOSPITAL_COMMUNITY): Payer: Self-pay

## 2022-08-06 ENCOUNTER — Telehealth: Payer: Self-pay

## 2022-08-06 NOTE — Telephone Encounter (Signed)
[facility] Patient Advocate Encounter  Patient's medication (Apretude) have been couriered to [facility] from [place] and will be administered on the patient next office visit on 08/13/22.  [name] , [facility] Patient [facility] for Infectious Disease Phone: [phone] Fax:  [phone]

## 2022-08-13 ENCOUNTER — Other Ambulatory Visit: Payer: Medicare Other

## 2022-08-13 ENCOUNTER — Ambulatory Visit: Payer: Medicare Other | Admitting: Pharmacist

## 2022-08-15 ENCOUNTER — Other Ambulatory Visit: Payer: Self-pay

## 2022-08-15 ENCOUNTER — Ambulatory Visit (INDEPENDENT_AMBULATORY_CARE_PROVIDER_SITE_OTHER): Payer: Medicare Other | Admitting: Pharmacist

## 2022-08-15 DIAGNOSIS — Z79899 Other long term (current) drug therapy: Secondary | ICD-10-CM

## 2022-08-15 DIAGNOSIS — Z23 Encounter for immunization: Secondary | ICD-10-CM | POA: Diagnosis not present

## 2022-08-15 MED ORDER — CABOTEGRAVIR ER 600 MG/3ML IM SUER
600.0000 mg | Freq: Once | INTRAMUSCULAR | Status: AC
  Administered 2022-08-15: 600 mg via INTRAMUSCULAR

## 2022-08-15 NOTE — Progress Notes (Signed)
HPI: Robert Mora is a 66 y.o. male who presents to the [place] clinic for Apretude administration and HIV PrEP follow up.  Patient Active Problem List   Diagnosis Date Noted   Musculoskeletal pain 08/07/2021   Acute bilateral low back pain without sciatica 01/30/2020   Benign microscopic hematuria 08/31/2019   Exposure to chlamydia 04/09/2017   Hyperlipemia 12/27/2011   Mood disorder ([place]) 08/11/2011   Hx of adenomatous colonic polyps 08/11/2011   Onychomycosis 08/11/2011   History of hepatitis B 08/11/2011    Patient's Medications  New Prescriptions   No medications on file  Previous Medications   BUPROPION (WELLBUTRIN XL) 300 MG 24 HR TABLET    TAKE 1 TABLET ONCE DAILY.   CABOTEGRAVIR ER (APRETUDE) 600 MG/3ML INJECTION    Inject 3 mLs (600 mg total) into the muscle every 30 (thirty) days.   CABOTEGRAVIR ER (APRETUDE) 600 MG/3ML INJECTION    Inject 3 mLs (600 mg total) into the muscle every 2 (two) months.   ROSUVASTATIN (CRESTOR) 10 MG TABLET    Take 1 tablet (10 mg total) by mouth daily.   TADALAFIL (CIALIS) 20 MG TABLET    Take 0.5-1 tablets (10-20 mg total) by mouth every other day as needed for erectile dysfunction.  Modified Medications   No medications on file  Discontinued Medications   No medications on file    Allergies: Allergies  Allergen Reactions   Celebrex [Celecoxib]    Terbinafine Hcl    Vioxx [Rofecoxib]     Past Medical History: Past Medical History:  Diagnosis Date   Adenomatous colon polyp    Heart murmur    as a child   Hx of adenomatous colonic polyps 08/11/2011   Insomnia     Social History: Social History   Socioeconomic History   Marital status: Single    Spouse name: Not on file   Number of children: Not on file   Years of education: Not on file   Highest education level: Not on file  Occupational History   Not on file  Tobacco Use   Smoking status: Former    Packs/day: 0.50    Years: 30.00    Total pack years:  15.00    Types: Cigarettes    Quit date: 06/11/2021    Years since quitting: 1.1    Passive exposure: Past   Smokeless tobacco: Never  Substance and Sexual Activity   Alcohol use: Yes    Alcohol/week: 0.0 standard drinks of alcohol    Comment: rarely/ beer   Drug use: No   Sexual activity: Not on file  Other Topics Concern   Not on file  Social History Narrative   Not on file   Social Determinants of Health   Financial Resource Strain: Not on file  Food Insecurity: Not on file  Transportation Needs: Not on file  Physical Activity: Not on file  Stress: Not on file  Social Connections: Not on file    Labs: Lab Results  Component Value Date   HIV1RNAQUANT Not Detected 06/12/2022   HIV1RNAQUANT Not Detected 04/10/2022   HIV1RNAQUANT Not Detected 02/13/2022    RPR and STI Lab Results  Component Value Date   LABRPR NON-REACTIVE 02/13/2022   LABRPR NON-REACTIVE 09/13/2021   LABRPR Non Reactive 06/14/2019   LABRPR NON-REACTIVE 12/10/2018    STI Results GC GC CT CT  04/10/2022  2:17 PM Negative    Negative    Negative   Negative    Negative  Negative    02/13/2022  8:35 AM Negative   Negative    09/13/2021  4:21 PM Negative    Negative   Negative    Negative    09/13/2021  4:06 PM Negative   Negative    03/20/2017 12:25 PM    NOT DETECTED   03/05/2016  3:47 PM  NOT DETECTED   NOT DETECTED     Hepatitis B Lab Results  Component Value Date   HEPBSAB REACTIVE (A) 09/13/2021   HEPBSAG NON-REACTIVE 09/13/2021   Hepatitis C Lab Results  Component Value Date   HEPCAB NON-REACTIVE 09/13/2021   Hepatitis A Lab Results  Component Value Date   HAV NON-REACTIVE 09/13/2021   Lipids: Lab Results  Component Value Date   CHOL 153 03/18/2022   TRIG 111 03/18/2022   HDL 46 03/18/2022   CHOLHDL 3.3 03/18/2022   VLDL 24 12/27/2016   LDLCALC 86 03/18/2022    TARGET DATE: 3rd   Assessment: Robert Mora presents today for his Apretude injection and to follow up for  HIV PrEP. No issues with past injections. Screened for acute HIV symptoms such as fatigue, muscle aches, rash, sore throat, lymphadenopathy, headache, night sweats, nausea/vomiting/diarrhea, and fever. Denies any symptoms. No known exposures to any STIs and no signs or symptoms of any STIs today.   Per [employer] guidelines, a rapid HIV test should be drawn prior to Apretude administration. Due to state shortage of rapid HIV tests, this is temporarily unable to be done. Per decision from [place], we will proceed with Apretude administration at this time without a negative rapid HIV test beforehand. HIV RNA was collected today and is in process.  Administered cabotegravir '600mg'$ /83m in right upper outer quadrant of the gluteal muscle. Injection was tolerated well without issue. Will see him back in 2 months for injection, labs, and HIV PrEP follow up.  [facility] [place] [facility] in the area.   Plan: - Apretude injection administered - HIV RNA today - Annual flu vaccine administered  - Next injection, labs, and PrEP follow up appointment scheduled for 10/14/2022 - Call with any issues or questions  CEliseo Gum PharmD PGY1 Pharmacy Resident   08/15/2022  11:08 AM

## 2022-08-18 LAB — HIV-1 RNA QUANT-NO REFLEX-BLD
HIV 1 RNA Quant: NOT DETECTED Copies/mL
HIV-1 RNA Quant, Log: NOT DETECTED Log cps/mL

## 2022-08-27 ENCOUNTER — Telehealth: Payer: Self-pay

## 2022-08-27 NOTE — Telephone Encounter (Signed)
[facility] Patient Advocate Encounter   Received notification from [place] that prior authorization for Apretude is required.   PA submitted on 08/27/22 Key [id number] Status is pending    [facility] will continue to follow.   [name], [facility] Patient [facility] for Infectious Disease Phone: [phone] Fax:  [phone]

## 2022-08-29 ENCOUNTER — Telehealth: Payer: Self-pay

## 2022-08-29 DIAGNOSIS — M9905 Segmental and somatic dysfunction of pelvic region: Secondary | ICD-10-CM | POA: Diagnosis not present

## 2022-08-29 DIAGNOSIS — M9903 Segmental and somatic dysfunction of lumbar region: Secondary | ICD-10-CM | POA: Diagnosis not present

## 2022-08-29 DIAGNOSIS — M5417 Radiculopathy, lumbosacral region: Secondary | ICD-10-CM | POA: Diagnosis not present

## 2022-08-29 DIAGNOSIS — M9904 Segmental and somatic dysfunction of sacral region: Secondary | ICD-10-CM | POA: Diagnosis not present

## 2022-08-29 NOTE — Telephone Encounter (Signed)
[facility] Patient Advocate Encounter  Prior Authorization for Apretude has been approved.  (Pharmacy benefits)  PA# [id number] Effective dates: 08/27/22 through 08/28/23  Patients co-pay is $0.00.   Prescription is being filled at [facility].  [facility] will continue to follow.  [name], [facility] Patient [facility] for Infectious Disease Phone: [phone] Fax:  [phone]

## 2022-09-02 ENCOUNTER — Encounter: Payer: Self-pay | Admitting: Pharmacist

## 2022-09-03 DIAGNOSIS — M9904 Segmental and somatic dysfunction of sacral region: Secondary | ICD-10-CM | POA: Diagnosis not present

## 2022-09-03 DIAGNOSIS — M9903 Segmental and somatic dysfunction of lumbar region: Secondary | ICD-10-CM | POA: Diagnosis not present

## 2022-09-03 DIAGNOSIS — M5417 Radiculopathy, lumbosacral region: Secondary | ICD-10-CM | POA: Diagnosis not present

## 2022-09-03 DIAGNOSIS — M9905 Segmental and somatic dysfunction of pelvic region: Secondary | ICD-10-CM | POA: Diagnosis not present

## 2022-09-05 DIAGNOSIS — M9905 Segmental and somatic dysfunction of pelvic region: Secondary | ICD-10-CM | POA: Diagnosis not present

## 2022-09-05 DIAGNOSIS — M9903 Segmental and somatic dysfunction of lumbar region: Secondary | ICD-10-CM | POA: Diagnosis not present

## 2022-09-05 DIAGNOSIS — M9904 Segmental and somatic dysfunction of sacral region: Secondary | ICD-10-CM | POA: Diagnosis not present

## 2022-09-05 DIAGNOSIS — M5417 Radiculopathy, lumbosacral region: Secondary | ICD-10-CM | POA: Diagnosis not present

## 2022-09-10 DIAGNOSIS — M9905 Segmental and somatic dysfunction of pelvic region: Secondary | ICD-10-CM | POA: Diagnosis not present

## 2022-09-10 DIAGNOSIS — M9904 Segmental and somatic dysfunction of sacral region: Secondary | ICD-10-CM | POA: Diagnosis not present

## 2022-09-10 DIAGNOSIS — M9903 Segmental and somatic dysfunction of lumbar region: Secondary | ICD-10-CM | POA: Diagnosis not present

## 2022-09-13 DIAGNOSIS — J209 Acute bronchitis, unspecified: Secondary | ICD-10-CM | POA: Diagnosis not present

## 2022-09-13 DIAGNOSIS — J398 Other specified diseases of upper respiratory tract: Secondary | ICD-10-CM | POA: Diagnosis not present

## 2022-09-19 DIAGNOSIS — M9904 Segmental and somatic dysfunction of sacral region: Secondary | ICD-10-CM | POA: Diagnosis not present

## 2022-09-19 DIAGNOSIS — M9905 Segmental and somatic dysfunction of pelvic region: Secondary | ICD-10-CM | POA: Diagnosis not present

## 2022-09-19 DIAGNOSIS — M9903 Segmental and somatic dysfunction of lumbar region: Secondary | ICD-10-CM | POA: Diagnosis not present

## 2022-09-30 DIAGNOSIS — M9905 Segmental and somatic dysfunction of pelvic region: Secondary | ICD-10-CM | POA: Diagnosis not present

## 2022-09-30 DIAGNOSIS — M9903 Segmental and somatic dysfunction of lumbar region: Secondary | ICD-10-CM | POA: Diagnosis not present

## 2022-09-30 DIAGNOSIS — M9904 Segmental and somatic dysfunction of sacral region: Secondary | ICD-10-CM | POA: Diagnosis not present

## 2022-10-07 ENCOUNTER — Other Ambulatory Visit: Payer: Self-pay | Admitting: Pharmacist

## 2022-10-07 ENCOUNTER — Other Ambulatory Visit (HOSPITAL_COMMUNITY): Payer: Self-pay

## 2022-10-07 DIAGNOSIS — Z79899 Other long term (current) drug therapy: Secondary | ICD-10-CM

## 2022-10-07 MED ORDER — APRETUDE 600 MG/3ML IM SUER
600.0000 mg | INTRAMUSCULAR | 5 refills | Status: AC
  Filled 2022-10-07 (×3): qty 3, 60d supply, fill #0

## 2022-10-08 ENCOUNTER — Other Ambulatory Visit (HOSPITAL_COMMUNITY): Payer: Self-pay

## 2022-10-09 ENCOUNTER — Other Ambulatory Visit (HOSPITAL_COMMUNITY): Payer: Self-pay

## 2022-10-10 ENCOUNTER — Telehealth: Payer: Self-pay

## 2022-10-10 NOTE — Telephone Encounter (Signed)
[facility] Patient Advocate Encounter  Patient's medication (Apretude) have been couriered to [facility] from [place] and will be administered on the patient next office visit on 10/14/22.  [name] , [facility] Patient [facility] for Infectious Disease Phone: [phone] Fax:  [phone]

## 2022-10-11 ENCOUNTER — Other Ambulatory Visit: Payer: Medicare Other

## 2022-10-11 DIAGNOSIS — E7800 Pure hypercholesterolemia, unspecified: Secondary | ICD-10-CM

## 2022-10-11 DIAGNOSIS — Z136 Encounter for screening for cardiovascular disorders: Secondary | ICD-10-CM

## 2022-10-11 DIAGNOSIS — Z125 Encounter for screening for malignant neoplasm of prostate: Secondary | ICD-10-CM

## 2022-10-12 LAB — CBC WITH DIFFERENTIAL/PLATELET
Absolute Monocytes: 647 cells/uL (ref 200–950)
Basophils Absolute: 23 cells/uL (ref 0–200)
Basophils Relative: 0.3 %
Eosinophils Absolute: 193 cells/uL (ref 15–500)
Eosinophils Relative: 2.5 %
HCT: 38.3 % — ABNORMAL LOW (ref 38.5–50.0)
Hemoglobin: 13.1 g/dL — ABNORMAL LOW (ref 13.2–17.1)
Lymphs Abs: 1679 cells/uL (ref 850–3900)
MCH: 32.3 pg (ref 27.0–33.0)
MCHC: 34.2 g/dL (ref 32.0–36.0)
MCV: 94.6 fL (ref 80.0–100.0)
MPV: 11.1 fL (ref 7.5–12.5)
Monocytes Relative: 8.4 %
Neutro Abs: 5159 cells/uL (ref 1500–7800)
Neutrophils Relative %: 67 %
Platelets: 179 10*3/uL (ref 140–400)
RBC: 4.05 10*6/uL — ABNORMAL LOW (ref 4.20–5.80)
RDW: 12.5 % (ref 11.0–15.0)
Total Lymphocyte: 21.8 %
WBC: 7.7 10*3/uL (ref 3.8–10.8)

## 2022-10-12 LAB — COMPLETE METABOLIC PANEL WITH GFR
AG Ratio: 1.7 (calc) (ref 1.0–2.5)
ALT: 20 U/L (ref 9–46)
AST: 22 U/L (ref 10–35)
Albumin: 4.2 g/dL (ref 3.6–5.1)
Alkaline phosphatase (APISO): 88 U/L (ref 35–144)
BUN: 12 mg/dL (ref 7–25)
CO2: 23 mmol/L (ref 20–32)
Calcium: 9.2 mg/dL (ref 8.6–10.3)
Chloride: 105 mmol/L (ref 98–110)
Creat: 0.95 mg/dL (ref 0.70–1.35)
Globulin: 2.5 g/dL (calc) (ref 1.9–3.7)
Glucose, Bld: 100 mg/dL — ABNORMAL HIGH (ref 65–99)
Potassium: 4.8 mmol/L (ref 3.5–5.3)
Sodium: 141 mmol/L (ref 135–146)
Total Bilirubin: 0.3 mg/dL (ref 0.2–1.2)
Total Protein: 6.7 g/dL (ref 6.1–8.1)
eGFR: 87 mL/min/{1.73_m2} (ref >=60)

## 2022-10-12 LAB — LIPID PANEL
Cholesterol: 186 mg/dL (ref <200)
HDL: 50 mg/dL (ref >=40)
LDL Cholesterol (Calc): 115 mg/dL (calc) — ABNORMAL HIGH
Non-HDL Cholesterol (Calc): 136 mg/dL (calc) — ABNORMAL HIGH (ref <130)
Total CHOL/HDL Ratio: 3.7 (calc) (ref <5.0)
Triglycerides: 105 mg/dL (ref <150)

## 2022-10-12 LAB — PSA: PSA: 0.4 ng/mL (ref <=4.00)

## 2022-10-14 ENCOUNTER — Encounter: Payer: Self-pay | Admitting: Internal Medicine

## 2022-10-14 ENCOUNTER — Other Ambulatory Visit (HOSPITAL_COMMUNITY)
Admission: RE | Admit: 2022-10-14 | Discharge: 2022-10-14 | Disposition: A | Discharge status: Home / Self Care | Payer: Medicare Other | Source: Ambulatory Visit | Attending: Internal Medicine | Admitting: Internal Medicine

## 2022-10-14 ENCOUNTER — Ambulatory Visit (INDEPENDENT_AMBULATORY_CARE_PROVIDER_SITE_OTHER): Payer: Medicare Other

## 2022-10-14 ENCOUNTER — Ambulatory Visit (INDEPENDENT_AMBULATORY_CARE_PROVIDER_SITE_OTHER): Payer: Medicare Other | Admitting: Internal Medicine

## 2022-10-14 ENCOUNTER — Other Ambulatory Visit: Payer: Self-pay

## 2022-10-14 ENCOUNTER — Ambulatory Visit: Payer: Medicare Other | Admitting: Pharmacist

## 2022-10-14 VITALS — BP 118/80 | HR 68 | Temp 97.3°F | Ht 68.0 in | Wt 152.0 lb

## 2022-10-14 VITALS — BP 110/80 | HR 67 | Temp 98.6°F | Ht 68.0 in | Wt 152.0 lb

## 2022-10-14 DIAGNOSIS — Z2981 Encounter for HIV pre-exposure prophylaxis: Secondary | ICD-10-CM | POA: Insufficient documentation

## 2022-10-14 DIAGNOSIS — E7800 Pure hypercholesterolemia, unspecified: Secondary | ICD-10-CM | POA: Diagnosis not present

## 2022-10-14 DIAGNOSIS — Z23 Encounter for immunization: Secondary | ICD-10-CM

## 2022-10-14 DIAGNOSIS — Z8669 Personal history of other diseases of the nervous system and sense organs: Secondary | ICD-10-CM

## 2022-10-14 DIAGNOSIS — Z86010 Personal history of colonic polyps: Secondary | ICD-10-CM

## 2022-10-14 DIAGNOSIS — F39 Unspecified mood [affective] disorder: Secondary | ICD-10-CM

## 2022-10-14 DIAGNOSIS — F17200 Nicotine dependence, unspecified, uncomplicated: Secondary | ICD-10-CM

## 2022-10-14 DIAGNOSIS — N529 Male erectile dysfunction, unspecified: Secondary | ICD-10-CM

## 2022-10-14 DIAGNOSIS — J3089 Other allergic rhinitis: Secondary | ICD-10-CM

## 2022-10-14 DIAGNOSIS — Z0000 Encounter for general adult medical examination without abnormal findings: Secondary | ICD-10-CM

## 2022-10-14 DIAGNOSIS — H9041 Sensorineural hearing loss, unilateral, right ear, with unrestricted hearing on the contralateral side: Secondary | ICD-10-CM | POA: Diagnosis not present

## 2022-10-14 DIAGNOSIS — D692 Other nonthrombocytopenic purpura: Secondary | ICD-10-CM

## 2022-10-14 LAB — POCT URINALYSIS DIPSTICK
Bilirubin, UA: NEGATIVE
Glucose, UA: NEGATIVE
Ketones, UA: NEGATIVE
Leukocytes, UA: NEGATIVE
Nitrite, UA: NEGATIVE
Protein, UA: NEGATIVE
Spec Grav, UA: 1.01 (ref 1.010–1.025)
Urobilinogen, UA: 0.2 E.U./dL
pH, UA: 6 (ref 5.0–8.0)

## 2022-10-14 LAB — HEMOCCULT GUIAC POC 1CARD (OFFICE): Fecal Occult Blood, POC: NEGATIVE

## 2022-10-14 MED ORDER — HYDROCODONE-ACETAMINOPHEN 10-325 MG PO TABS: 1.0000 | ORAL_TABLET | Freq: Three times a day (TID) | ORAL | 0 refills | Status: AC | PRN

## 2022-10-14 MED ORDER — CABOTEGRAVIR ER 600 MG/3ML IM SUER
600.0000 mg | Freq: Once | INTRAMUSCULAR | Status: AC
  Administered 2022-10-14: 600 mg via INTRAMUSCULAR

## 2022-10-14 NOTE — Progress Notes (Signed)
Patient Active Problem List   Diagnosis Date Noted   Musculoskeletal pain 08/07/2021   Acute bilateral low back pain without sciatica 01/30/2020   Benign microscopic hematuria 08/31/2019   Exposure to chlamydia 04/09/2017   Hyperlipemia 12/27/2011   Mood disorder ([place]) 08/11/2011   Hx of adenomatous colonic polyps 08/11/2011   Onychomycosis 08/11/2011   History of hepatitis B 08/11/2011    Patient's Medications  New Prescriptions   No medications on file  Previous Medications   BUPROPION (WELLBUTRIN XL) 300 MG 24 HR TABLET    TAKE 1 TABLET ONCE DAILY.   CABOTEGRAVIR ER (APRETUDE) 600 MG/3ML INJECTION    Inject 3 mLs (600 mg total) into the muscle every 2 (two) months.   ROSUVASTATIN (CRESTOR) 10 MG TABLET    Take 1 tablet (10 mg total) by mouth daily.   TADALAFIL (CIALIS) 20 MG TABLET    Take 0.5-1 tablets (10-20 mg total) by mouth every other day as needed for erectile dysfunction.  Modified Medications   No medications on file  Discontinued Medications   No medications on file    Subjective: 66 Ym presents for PREP management on injectibles. His  last dose/visit was with Pharmacy on 08/15/22. Reprots he is sexually active with one partner.  No new complaints.  Review of Systems: Review of Systems  All other systems reviewed and are negative.   Past Medical History:  Diagnosis Date   Adenomatous colon polyp    Heart murmur    as a child   Hx of adenomatous colonic polyps 08/11/2011   Insomnia     Social History   Tobacco Use   Smoking status: Every Day    Packs/day: 0.10    Years: 30.00    Total pack years: 3.00    Types: Cigarettes    Last attempt to quit: 06/11/2021    Years since quitting: 1.3    Passive exposure: Past   Smokeless tobacco: Never   Tobacco comments:    1 pack per week   Substance Use Topics   Alcohol use: Yes    Alcohol/week: 0.0 standard drinks of alcohol    Comment: rarely/ beer   Drug use: No    Family History   Problem Relation Age of Onset   Hyperlipidemia Mother    Heart disease Father    Colon cancer Neg Hx    Colon polyps Neg Hx     Allergies  Allergen Reactions   Celebrex [Celecoxib]    Terbinafine Hcl    Vioxx [Rofecoxib]     Health Maintenance  Topic Date Due   Zoster Vaccines- Shingrix (1 of 2) Never done   COLONOSCOPY (Pts 45-39yr Insurance coverage will need to be confirmed)  02/05/2021   DTaP/Tdap/Td (2 - Td or Tdap) 04/27/2022   Medicare Annual Wellness (AWV)  09/17/2022   Pneumonia Vaccine 66 Years old  Completed   INFLUENZA VACCINE  Completed   Hepatitis C Screening  Completed   HPV VACCINES  Aged Out   COVID-19 Vaccine  Discontinued    Objective:  Vitals:   10/14/22 1335  Pulse: 68  Temp: (!) 97.3 F (36.3 C)  TempSrc: Oral  SpO2: 98%  Weight: 152 lb (68.9 kg)  Height: '5\' 8"'$  (1.727 m)   Body mass index is 23.11 kg/m.  Physical Exam Constitutional:      General: He is not in acute distress.    Appearance: He is normal weight. He is not  toxic-appearing.  HENT:     Head: Normocephalic and atraumatic.     Right Ear: External ear normal.     Left Ear: External ear normal.     Nose: No congestion or rhinorrhea.     Mouth/Throat:     Mouth: Mucous membranes are moist.     Pharynx: Oropharynx is clear.  Eyes:     Extraocular Movements: Extraocular movements intact.     Conjunctiva/sclera: Conjunctivae normal.     Pupils: Pupils are equal, round, and reactive to light.  Cardiovascular:     Rate and Rhythm: Normal rate and regular rhythm.     Heart sounds: No murmur heard.    No friction rub. No gallop.  Pulmonary:     Effort: Pulmonary effort is normal.     Breath sounds: Normal breath sounds.  Abdominal:     General: Abdomen is flat. Bowel sounds are normal.     Palpations: Abdomen is soft.  Musculoskeletal:        General: No swelling. Normal range of motion.     Cervical back: Normal range of motion and neck supple.  Skin:    General: Skin  is warm and dry.  Neurological:     General: No focal deficit present.     Mental Status: He is oriented to person, place, and time.  Psychiatric:        Mood and Affect: Mood normal.     Lab Results Lab Results  Component Value Date   WBC 7.7 10/11/2022   HGB 13.1 (L) 10/11/2022   HCT 38.3 (L) 10/11/2022   MCV 94.6 10/11/2022   PLT 179 10/11/2022    Lab Results  Component Value Date   CREATININE 0.95 10/11/2022   BUN 12 10/11/2022   NA 141 10/11/2022   K 4.8 10/11/2022   CL 105 10/11/2022   CO2 23 10/11/2022    Lab Results  Component Value Date   ALT 20 10/11/2022   AST 22 10/11/2022   ALKPHOS 75 06/14/2019   BILITOT 0.3 10/11/2022    Lab Results  Component Value Date   CHOL 186 10/11/2022   HDL 50 10/11/2022   LDLCALC 115 (H) 10/11/2022   TRIG 105 10/11/2022   CHOLHDL 3.7 10/11/2022   Lab Results  Component Value Date   LABRPR NON-REACTIVE 02/13/2022   HIV 1 RNA Quant (Copies/mL)  Date Value  08/15/2022 Not Detected  06/12/2022 Not Detected  04/10/2022 Not Detected     #PREP with Appertude HIV RNA-ND on 08/15/22 RPR NR, GC une negative on 04/10/22 CBC/CMP nl on 10/11/22 Plan HIV AB and rna today GC three point and RPR F/U with Pharmacy in 2 months for next dose  Laurice Record, [facility] Infectious [facility] 10/14/2022, 1:46 PM

## 2022-10-14 NOTE — Progress Notes (Deleted)
   Subjective:    Patient ID: Robert Mora, male    DOB: September 29, 1956, 66 y.o.   MRN: [id number]  HPI 66 year old Male seen for     Review of Systems Has had flu vaccine. Had Covid booster.     Objective:   Physical Exam        Assessment & Plan:

## 2022-10-15 LAB — CYTOLOGY, (ORAL, ANAL, URETHRAL) ANCILLARY ONLY
Chlamydia: NEGATIVE
Chlamydia: NEGATIVE
Comment: NEGATIVE
Comment: NEGATIVE
Comment: NORMAL
Comment: NORMAL
Neisseria Gonorrhea: NEGATIVE
Neisseria Gonorrhea: NEGATIVE

## 2022-10-15 LAB — URINE CYTOLOGY ANCILLARY ONLY
Chlamydia: NEGATIVE
Comment: NEGATIVE
Comment: NORMAL
Neisseria Gonorrhea: NEGATIVE

## 2022-10-16 LAB — HIV ANTIBODY (ROUTINE TESTING W REFLEX): HIV 1&2 Ab, 4th Generation: NONREACTIVE

## 2022-10-16 LAB — HIV-1 RNA QUANT-NO REFLEX-BLD
HIV 1 RNA Quant: NOT DETECTED Copies/mL
HIV-1 RNA Quant, Log: NOT DETECTED Log cps/mL

## 2022-10-16 LAB — RPR: RPR Ser Ql: NONREACTIVE

## 2022-10-17 DIAGNOSIS — Z79899 Other long term (current) drug therapy: Secondary | ICD-10-CM | POA: Diagnosis not present

## 2022-10-17 DIAGNOSIS — N402 Nodular prostate without lower urinary tract symptoms: Secondary | ICD-10-CM | POA: Diagnosis not present

## 2022-10-17 DIAGNOSIS — E782 Mixed hyperlipidemia: Secondary | ICD-10-CM | POA: Diagnosis not present

## 2022-10-17 DIAGNOSIS — F3341 Major depressive disorder, recurrent, in partial remission: Secondary | ICD-10-CM | POA: Diagnosis not present
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
## 2022-10-21 DIAGNOSIS — M9904 Segmental and somatic dysfunction of sacral region: Secondary | ICD-10-CM | POA: Diagnosis not present

## 2022-10-21 DIAGNOSIS — M9903 Segmental and somatic dysfunction of lumbar region: Secondary | ICD-10-CM | POA: Diagnosis not present

## 2022-10-21 DIAGNOSIS — M9905 Segmental and somatic dysfunction of pelvic region: Secondary | ICD-10-CM | POA: Diagnosis not present

## 2022-11-19 DIAGNOSIS — M9905 Segmental and somatic dysfunction of pelvic region: Secondary | ICD-10-CM | POA: Diagnosis not present

## 2022-11-19 DIAGNOSIS — M9903 Segmental and somatic dysfunction of lumbar region: Secondary | ICD-10-CM | POA: Diagnosis not present

## 2022-11-19 DIAGNOSIS — M5417 Radiculopathy, lumbosacral region: Secondary | ICD-10-CM | POA: Diagnosis not present

## 2022-11-19 DIAGNOSIS — M9904 Segmental and somatic dysfunction of sacral region: Secondary | ICD-10-CM | POA: Diagnosis not present

## 2022-11-22 DIAGNOSIS — Z6823 Body mass index (BMI) 23.0-23.9, adult: Secondary | ICD-10-CM | POA: Diagnosis not present

## 2022-11-22 DIAGNOSIS — N402 Nodular prostate without lower urinary tract symptoms: Secondary | ICD-10-CM | POA: Diagnosis not present

## 2022-11-29 ENCOUNTER — Other Ambulatory Visit (HOSPITAL_COMMUNITY): Payer: Self-pay

## 2022-12-02 ENCOUNTER — Other Ambulatory Visit (HOSPITAL_COMMUNITY): Payer: Self-pay

## 2022-12-17 DIAGNOSIS — M9903 Segmental and somatic dysfunction of lumbar region: Secondary | ICD-10-CM | POA: Diagnosis not present

## 2022-12-17 DIAGNOSIS — M9904 Segmental and somatic dysfunction of sacral region: Secondary | ICD-10-CM | POA: Diagnosis not present

## 2022-12-17 DIAGNOSIS — M9905 Segmental and somatic dysfunction of pelvic region: Secondary | ICD-10-CM | POA: Diagnosis not present

## 2023-01-14 DIAGNOSIS — M9903 Segmental and somatic dysfunction of lumbar region: Secondary | ICD-10-CM | POA: Diagnosis not present

## 2023-01-14 DIAGNOSIS — M9904 Segmental and somatic dysfunction of sacral region: Secondary | ICD-10-CM | POA: Diagnosis not present

## 2023-01-14 DIAGNOSIS — M9905 Segmental and somatic dysfunction of pelvic region: Secondary | ICD-10-CM | POA: Diagnosis not present

## 2023-02-11 DIAGNOSIS — M9903 Segmental and somatic dysfunction of lumbar region: Secondary | ICD-10-CM | POA: Diagnosis not present

## 2023-02-11 DIAGNOSIS — M9905 Segmental and somatic dysfunction of pelvic region: Secondary | ICD-10-CM | POA: Diagnosis not present

## 2023-02-11 DIAGNOSIS — M9904 Segmental and somatic dysfunction of sacral region: Secondary | ICD-10-CM | POA: Diagnosis not present

## 2023-03-13 DIAGNOSIS — M9904 Segmental and somatic dysfunction of sacral region: Secondary | ICD-10-CM | POA: Diagnosis not present

## 2023-03-13 DIAGNOSIS — M9905 Segmental and somatic dysfunction of pelvic region: Secondary | ICD-10-CM | POA: Diagnosis not present

## 2023-03-13 DIAGNOSIS — M9903 Segmental and somatic dysfunction of lumbar region: Secondary | ICD-10-CM | POA: Diagnosis not present

## 2023-04-08 DIAGNOSIS — M9905 Segmental and somatic dysfunction of pelvic region: Secondary | ICD-10-CM | POA: Diagnosis not present

## 2023-04-08 DIAGNOSIS — M9904 Segmental and somatic dysfunction of sacral region: Secondary | ICD-10-CM | POA: Diagnosis not present

## 2023-04-08 DIAGNOSIS — M9903 Segmental and somatic dysfunction of lumbar region: Secondary | ICD-10-CM | POA: Diagnosis not present

## 2023-04-16 DIAGNOSIS — E782 Mixed hyperlipidemia: Secondary | ICD-10-CM | POA: Diagnosis not present

## 2023-04-23 DIAGNOSIS — E782 Mixed hyperlipidemia: Secondary | ICD-10-CM | POA: Diagnosis not present

## 2023-04-23 DIAGNOSIS — D649 Anemia, unspecified: Secondary | ICD-10-CM | POA: Diagnosis not present

## 2023-04-23 DIAGNOSIS — F3341 Major depressive disorder, recurrent, in partial remission: Secondary | ICD-10-CM | POA: Diagnosis not present

## 2023-04-23 DIAGNOSIS — Z0000 Encounter for general adult medical examination without abnormal findings: Secondary | ICD-10-CM | POA: Diagnosis not present

## 2023-04-23 DIAGNOSIS — Z79899 Other long term (current) drug therapy: Secondary | ICD-10-CM | POA: Diagnosis not present

## 2023-04-23 DIAGNOSIS — Z6824 Body mass index (BMI) 24.0-24.9, adult: Secondary | ICD-10-CM | POA: Diagnosis not present

## 2023-04-23 DIAGNOSIS — N402 Nodular prostate without lower urinary tract symptoms: Secondary | ICD-10-CM | POA: Diagnosis not present

## 2023-05-02 ENCOUNTER — Telehealth: Payer: Self-pay | Admitting: Clinic/Center

## 2023-05-02 DIAGNOSIS — Z0289 Encounter for other administrative examinations: Secondary | ICD-10-CM

## 2023-05-02 NOTE — Telephone Encounter (Signed)
Faxed Medical Records to [facility] [phone]

## 2023-05-06 DIAGNOSIS — M9903 Segmental and somatic dysfunction of lumbar region: Secondary | ICD-10-CM | POA: Diagnosis not present

## 2023-05-06 DIAGNOSIS — M9904 Segmental and somatic dysfunction of sacral region: Secondary | ICD-10-CM | POA: Diagnosis not present

## 2023-05-06 DIAGNOSIS — M9905 Segmental and somatic dysfunction of pelvic region: Secondary | ICD-10-CM | POA: Diagnosis not present

## 2023-05-07 DIAGNOSIS — M9905 Segmental and somatic dysfunction of pelvic region: Secondary | ICD-10-CM | POA: Diagnosis not present

## 2023-05-07 DIAGNOSIS — M9903 Segmental and somatic dysfunction of lumbar region: Secondary | ICD-10-CM | POA: Diagnosis not present

## 2023-05-07 DIAGNOSIS — M5417 Radiculopathy, lumbosacral region: Secondary | ICD-10-CM | POA: Diagnosis not present

## 2023-05-07 DIAGNOSIS — M9904 Segmental and somatic dysfunction of sacral region: Secondary | ICD-10-CM | POA: Diagnosis not present

## 2023-05-08 DIAGNOSIS — F3342 Major depressive disorder, recurrent, in full remission: Secondary | ICD-10-CM | POA: Diagnosis not present

## 2023-05-14 DIAGNOSIS — M5417 Radiculopathy, lumbosacral region: Secondary | ICD-10-CM | POA: Diagnosis not present

## 2023-05-14 DIAGNOSIS — M9903 Segmental and somatic dysfunction of lumbar region: Secondary | ICD-10-CM | POA: Diagnosis not present

## 2023-05-14 DIAGNOSIS — M9904 Segmental and somatic dysfunction of sacral region: Secondary | ICD-10-CM | POA: Diagnosis not present

## 2023-05-14 DIAGNOSIS — M9905 Segmental and somatic dysfunction of pelvic region: Secondary | ICD-10-CM | POA: Diagnosis not present

## 2023-05-16 DIAGNOSIS — N402 Nodular prostate without lower urinary tract symptoms: Secondary | ICD-10-CM | POA: Diagnosis not present

## 2023-05-16 DIAGNOSIS — D4709 Other mast cell neoplasms of uncertain behavior: Secondary | ICD-10-CM | POA: Diagnosis not present

## 2023-05-16 DIAGNOSIS — Z8619 Personal history of other infectious and parasitic diseases: Secondary | ICD-10-CM | POA: Diagnosis not present

## 2023-05-16 DIAGNOSIS — Z113 Encounter for screening for infections with a predominantly sexual mode of transmission: Secondary | ICD-10-CM | POA: Diagnosis not present

## 2023-05-23 DIAGNOSIS — Z6824 Body mass index (BMI) 24.0-24.9, adult: Secondary | ICD-10-CM | POA: Diagnosis not present

## 2023-05-23 DIAGNOSIS — N402 Nodular prostate without lower urinary tract symptoms: Secondary | ICD-10-CM | POA: Diagnosis not present

## 2023-06-03 DIAGNOSIS — M9903 Segmental and somatic dysfunction of lumbar region: Secondary | ICD-10-CM | POA: Diagnosis not present

## 2023-06-03 DIAGNOSIS — M9905 Segmental and somatic dysfunction of pelvic region: Secondary | ICD-10-CM | POA: Diagnosis not present

## 2023-06-03 DIAGNOSIS — M9904 Segmental and somatic dysfunction of sacral region: Secondary | ICD-10-CM | POA: Diagnosis not present

## 2023-06-20 DIAGNOSIS — N402 Nodular prostate without lower urinary tract symptoms: Secondary | ICD-10-CM | POA: Diagnosis not present

## 2023-06-24 DIAGNOSIS — Z6824 Body mass index (BMI) 24.0-24.9, adult: Secondary | ICD-10-CM | POA: Diagnosis not present

## 2023-06-24 DIAGNOSIS — R0789 Other chest pain: Secondary | ICD-10-CM | POA: Diagnosis not present

## 2023-06-24 DIAGNOSIS — R109 Unspecified abdominal pain: Secondary | ICD-10-CM | POA: Diagnosis not present

## 2023-06-24 DIAGNOSIS — E782 Mixed hyperlipidemia: Secondary | ICD-10-CM | POA: Diagnosis not present

## 2023-07-01 DIAGNOSIS — M9904 Segmental and somatic dysfunction of sacral region: Secondary | ICD-10-CM | POA: Diagnosis not present

## 2023-07-01 DIAGNOSIS — M9905 Segmental and somatic dysfunction of pelvic region: Secondary | ICD-10-CM | POA: Diagnosis not present

## 2023-07-01 DIAGNOSIS — M9903 Segmental and somatic dysfunction of lumbar region: Secondary | ICD-10-CM | POA: Diagnosis not present

## 2023-07-01 DIAGNOSIS — M5417 Radiculopathy, lumbosacral region: Secondary | ICD-10-CM | POA: Diagnosis not present

## 2023-07-29 DIAGNOSIS — M9905 Segmental and somatic dysfunction of pelvic region: Secondary | ICD-10-CM | POA: Diagnosis not present

## 2023-07-29 DIAGNOSIS — M9903 Segmental and somatic dysfunction of lumbar region: Secondary | ICD-10-CM | POA: Diagnosis not present

## 2023-07-29 DIAGNOSIS — M9904 Segmental and somatic dysfunction of sacral region: Secondary | ICD-10-CM | POA: Diagnosis not present

## 2023-08-26 DIAGNOSIS — M9904 Segmental and somatic dysfunction of sacral region: Secondary | ICD-10-CM | POA: Diagnosis not present

## 2023-08-26 DIAGNOSIS — M9903 Segmental and somatic dysfunction of lumbar region: Secondary | ICD-10-CM | POA: Diagnosis not present

## 2023-08-26 DIAGNOSIS — M9905 Segmental and somatic dysfunction of pelvic region: Secondary | ICD-10-CM | POA: Diagnosis not present

## 2023-08-27 DIAGNOSIS — D4709 Other mast cell neoplasms of uncertain behavior: Secondary | ICD-10-CM | POA: Diagnosis not present

## 2023-08-27 DIAGNOSIS — E782 Mixed hyperlipidemia: Secondary | ICD-10-CM | POA: Diagnosis not present

## 2023-08-27 DIAGNOSIS — Z113 Encounter for screening for infections with a predominantly sexual mode of transmission: Secondary | ICD-10-CM | POA: Diagnosis not present

## 2023-09-02 DIAGNOSIS — D125 Benign neoplasm of sigmoid colon: Secondary | ICD-10-CM | POA: Diagnosis not present

## 2023-09-02 DIAGNOSIS — Z1211 Encounter for screening for malignant neoplasm of colon: Secondary | ICD-10-CM | POA: Diagnosis not present

## 2023-09-02 DIAGNOSIS — D123 Benign neoplasm of transverse colon: Secondary | ICD-10-CM | POA: Diagnosis not present

## 2023-09-02 DIAGNOSIS — D122 Benign neoplasm of ascending colon: Secondary | ICD-10-CM | POA: Diagnosis not present

## 2023-09-02 DIAGNOSIS — K5730 Diverticulosis of large intestine without perforation or abscess without bleeding: Secondary | ICD-10-CM | POA: Diagnosis not present

## 2023-09-02 DIAGNOSIS — K635 Polyp of colon: Secondary | ICD-10-CM | POA: Diagnosis not present

## 2023-09-23 DIAGNOSIS — M9903 Segmental and somatic dysfunction of lumbar region: Secondary | ICD-10-CM | POA: Diagnosis not present

## 2023-09-23 DIAGNOSIS — M9904 Segmental and somatic dysfunction of sacral region: Secondary | ICD-10-CM | POA: Diagnosis not present

## 2023-09-23 DIAGNOSIS — M9905 Segmental and somatic dysfunction of pelvic region: Secondary | ICD-10-CM | POA: Diagnosis not present

## 2023-10-16 DIAGNOSIS — D649 Anemia, unspecified: Secondary | ICD-10-CM | POA: Diagnosis not present

## 2023-10-16 DIAGNOSIS — E782 Mixed hyperlipidemia: Secondary | ICD-10-CM | POA: Diagnosis not present

## 2023-10-22 DIAGNOSIS — R053 Chronic cough: Secondary | ICD-10-CM | POA: Diagnosis not present

## 2023-10-22 DIAGNOSIS — Z23 Encounter for immunization: Secondary | ICD-10-CM | POA: Diagnosis not present

## 2023-10-22 DIAGNOSIS — D649 Anemia, unspecified: Secondary | ICD-10-CM | POA: Diagnosis not present

## 2023-10-22 DIAGNOSIS — E782 Mixed hyperlipidemia: Secondary | ICD-10-CM | POA: Diagnosis not present

## 2023-10-22 DIAGNOSIS — F3341 Major depressive disorder, recurrent, in partial remission: Secondary | ICD-10-CM | POA: Diagnosis not present

## 2023-10-28 DIAGNOSIS — M9904 Segmental and somatic dysfunction of sacral region: Secondary | ICD-10-CM | POA: Diagnosis not present

## 2023-10-28 DIAGNOSIS — M9905 Segmental and somatic dysfunction of pelvic region: Secondary | ICD-10-CM | POA: Diagnosis not present

## 2023-10-28 DIAGNOSIS — M9903 Segmental and somatic dysfunction of lumbar region: Secondary | ICD-10-CM | POA: Diagnosis not present
